# Patient Record
Sex: Male | Born: 1945 | Race: White | Hispanic: No | Marital: Married | Smoking: Never smoker
Health system: Southern US, Community
[De-identification: ages and names within clinical notes are randomized; demographics above are authoritative.]

## PROBLEM LIST (undated history)

## (undated) DIAGNOSIS — R011 Cardiac murmur, unspecified: Secondary | ICD-10-CM

## (undated) DIAGNOSIS — E1010 Type 1 diabetes mellitus with ketoacidosis without coma: Secondary | ICD-10-CM

## (undated) DIAGNOSIS — I350 Nonrheumatic aortic (valve) stenosis: Secondary | ICD-10-CM

## (undated) DIAGNOSIS — R4189 Other symptoms and signs involving cognitive functions and awareness: Secondary | ICD-10-CM

## (undated) HISTORY — DX: Cardiac murmur, unspecified: R01.1

## (undated) HISTORY — PX: SHOULDER ARTHROSCOPY WITH ROTATOR CUFF REPAIR: SHX5685

## (undated) HISTORY — DX: Other symptoms and signs involving cognitive functions and awareness: R41.89

## (undated) HISTORY — PX: APPENDECTOMY: SHX54

## (undated) HISTORY — PX: KIDNEY STONE SURGERY: SHX686

## (undated) HISTORY — DX: Type 1 diabetes mellitus with ketoacidosis without coma: E10.10

## (undated) HISTORY — DX: Nonrheumatic aortic (valve) stenosis: I35.0

---

## 2010-01-28 LAB — HM COLONOSCOPY

## 2022-02-09 ENCOUNTER — Ambulatory Visit (INDEPENDENT_AMBULATORY_CARE_PROVIDER_SITE_OTHER): Payer: Medicare Other | Admitting: Family Medicine

## 2022-02-09 ENCOUNTER — Other Ambulatory Visit: Payer: Self-pay

## 2022-02-09 ENCOUNTER — Encounter: Payer: Self-pay | Admitting: Family Medicine

## 2022-02-09 VITALS — BP 101/64 | HR 80 | Ht 64.0 in | Wt 162.0 lb

## 2022-02-09 DIAGNOSIS — E119 Type 2 diabetes mellitus without complications: Secondary | ICD-10-CM

## 2022-02-09 DIAGNOSIS — G20 Parkinson's disease: Secondary | ICD-10-CM | POA: Insufficient documentation

## 2022-02-09 DIAGNOSIS — I351 Nonrheumatic aortic (valve) insufficiency: Secondary | ICD-10-CM

## 2022-02-09 DIAGNOSIS — R2681 Unsteadiness on feet: Secondary | ICD-10-CM | POA: Diagnosis not present

## 2022-02-09 DIAGNOSIS — F418 Other specified anxiety disorders: Secondary | ICD-10-CM | POA: Insufficient documentation

## 2022-02-09 DIAGNOSIS — E7800 Pure hypercholesterolemia, unspecified: Secondary | ICD-10-CM

## 2022-02-09 DIAGNOSIS — G20C Parkinsonism, unspecified: Secondary | ICD-10-CM

## 2022-02-09 MED ORDER — ATORVASTATIN CALCIUM 10 MG PO TABS: 10.0000 mg | ORAL_TABLET | Freq: Every day | ORAL | 3 refills | Status: DC

## 2022-02-09 MED ORDER — EPINEPHRINE 0.3 MG/0.3ML IJ SOAJ: 0.3000 mg | INTRAMUSCULAR | 1 refills | Status: DC | PRN

## 2022-02-09 MED ORDER — DAPAGLIFLOZIN PROPANEDIOL 5 MG PO TABS: 5.0000 mg | ORAL_TABLET | Freq: Every day | ORAL | 3 refills | Status: DC

## 2022-02-09 MED ORDER — CARBIDOPA-LEVODOPA 25-100 MG PO TABS: 1.0000 | ORAL_TABLET | Freq: Four times a day (QID) | ORAL | 1 refills | Status: DC

## 2022-02-09 MED ORDER — BUPROPION HCL ER (XL) 300 MG PO TB24
300.0000 mg | ORAL_TABLET | Freq: Every day | ORAL | 1 refills | Status: DC
Start: 2022-02-09 — End: 2022-07-27

## 2022-02-09 MED ORDER — TAMSULOSIN HCL 0.4 MG PO CAPS: 0.4000 mg | ORAL_CAPSULE | Freq: Every day | ORAL | 3 refills | Status: DC

## 2022-02-09 MED ORDER — DONEPEZIL HCL 10 MG PO TABS: 10.0000 mg | ORAL_TABLET | Freq: Every day | ORAL | 1 refills | Status: DC

## 2022-02-09 NOTE — Patient Instructions (Signed)
Nice to meet you today! ?Change bupropion 150mg  twice per day to 300mg  once per day.  ? ?

## 2022-02-09 NOTE — Assessment & Plan Note (Signed)
Tolerating atorvastatin well.  Update lipid panel.  

## 2022-02-09 NOTE — Assessment & Plan Note (Signed)
Changing to bupropion XL and increasing to 300 mg daily. ?

## 2022-02-09 NOTE — Assessment & Plan Note (Signed)
Has parkinsonism with associated dementia.  Fairly stable with current medications.  Referral placed to neurology.  We will also place referral to physical therapy due to gait instability and falls. ?

## 2022-02-09 NOTE — Assessment & Plan Note (Signed)
Updating A1c.  He does have Visit with endocrinology. ?

## 2022-02-09 NOTE — Progress Notes (Signed)
?Ralph Jensen - 76 y.o. male MRN [id number]  Date of birth: 07-17-1946 ? ?Subjective ?Chief Complaint  ?Patient presents with  ? Establish Care  ? ? ?HPI ?Ralph Jensen is a 76 year old male here today for initial visit to establish care.  He is accompanied by his wife today.  They recently moved to the area from [place].  He has a history of parkinsonism as well as dementia.  He did have a neurologist as well as neuropsychiatric evaluation while living in [place].  Current treatment with Sinemet 4 times daily as well as Aricept 10 mg daily.  Overall he is stable with current medications.  He has had a few falls over the past couple months.  Requesting referral to physical therapy as well as to establish with neurology in the area. ? ?He has had some anxiety as well as depressive symptoms.  Current treatment with bupropion.  Tried sertraline previously but did not tolerate well.  Overall doing well bupropion but feels like dose could be adjusted. ? ?He does have history of diabetes.  He has been prescribed Farxiga and glipizide.  He was having some hypoglycemia so glipizide was held.  He has reduced his Farxiga to 5 mg.  He has already gotten an appointment with endocrinology. ? ?History of "leaky aortic valve".  Was having annual echocardiograms with cardiology and [place].  He is overdue to have this completed.  He denies any increased dyspnea or chest pain. ? ?ROS:  A comprehensive ROS was completed and negative except as noted per HPI ? ? ?Allergies  ?Allergen Reactions  ? Bee Venom   ? Fluoxetine   ? Metformin And Related   ? ? ?History reviewed. No pertinent past medical history. ? ?Past Surgical History:  ?Procedure Laterality Date  ? APPENDECTOMY    ? KIDNEY STONE SURGERY    ? SHOULDER ARTHROSCOPY WITH ROTATOR CUFF REPAIR Left   ? ? ?Social History  ? ?Socioeconomic History  ? Marital status: Married  ?  Spouse name: Not on file  ? Number of children: Not on file  ? Years of education: Not on file  ? Highest  education level: Not on file  ?Occupational History  ? Not on file  ?Tobacco Use  ? Smoking status: Never  ?  Passive exposure: Never  ? Smokeless tobacco: Never  ?Vaping Use  ? Vaping Use: Never used  ?Substance and Sexual Activity  ? Alcohol use: Never  ? Drug use: Never  ? Sexual activity: Not Currently  ?  Partners: Female  ?Other Topics Concern  ? Not on file  ?Social History Narrative  ? Not on file  ? ?Social Determinants of Health  ? ?Financial Resource Strain: Not on file  ?Food Insecurity: Not on file  ?Transportation Needs: Not on file  ?Physical Activity: Not on file  ?Stress: Not on file  ?Social Connections: Not on file  ? ? ?Family History  ?Problem Relation Age of Onset  ? Heart attack Mother   ? ? ?Health Maintenance  ?Topic Date Due  ? URINE MICROALBUMIN  Never done  ? Hepatitis C Screening  Never done  ? COVID-19 Vaccine (5 - Booster for Moderna series) 02/25/2022 (Originally 05/10/2021)  ? Pneumonia Vaccine 79+ Years old (1 - PCV) 02/10/2023 (Originally 07/02/2010)  ? TETANUS/TDAP  02/10/2023 (Originally 07/02/1964)  ? INFLUENZA VACCINE  Completed  ? Zoster Vaccines- Shingrix  Completed  ? HPV VACCINES  Aged Out  ? ? ? ?----------------------------------------------------------------------------------------------------------------------------------------------------------------------------------------------------------------- ?Physical Exam ?BP 101/64 (BP Location: Left  Arm, Patient Position: Sitting, Cuff Size: Normal)   Pulse 80   Ht 5\' 4"  (1.626 m)   Wt 162 lb (73.5 kg)   SpO2 99%   BMI 27.81 kg/m?  ? ?Physical Exam ?Constitutional:   ?   Appearance: Normal appearance.  ?HENT:  ?   Head: Normocephalic and atraumatic.  ?Eyes:  ?   General: No scleral icterus. ?Cardiovascular:  ?   Rate and Rhythm: Normal rate and regular rhythm.  ?   Heart sounds: Murmur (Systolic ejection murmur 2 out of 6) heard.  ?Musculoskeletal:  ?   Cervical back: Neck supple.  ?Neurological:  ?   General: No focal  deficit present.  ?   Mental Status: He is alert.  ?Psychiatric:     ?   Mood and Affect: Mood normal.     ?   Behavior: Behavior normal.  ? ? ?------------------------------------------------------------------------------------------------------------------------------------------------------------------------------------------------------------------- ?Assessment and Plan ? ?Parkinsonism (HCC) ?Has parkinsonism with associated dementia.  Fairly stable with current medications.  Referral placed to neurology.  We will also place referral to physical therapy due to gait instability and falls. ? ?Depression with anxiety ?Changing to bupropion XL and increasing to 300 mg daily. ? ?Pure hypercholesterolemia ?Tolerating atorvastatin well.  Update lipid panel. ? ?Type 2 diabetes mellitus without complication, without long-term current use of insulin (HCC) ?Updating A1c.  He does have Visit with endocrinology. ? ? ?Meds ordered this encounter  ?Medications  ? atorvastatin (LIPITOR) 10 MG tablet  ?  Sig: Take 1 tablet (10 mg total) by mouth daily.  ?  Dispense:  90 tablet  ?  Refill:  3  ? carbidopa-levodopa (SINEMET IR) 25-100 MG tablet  ?  Sig: Take 1 tablet by mouth 4 (four) times daily.  ?  Dispense:  360 tablet  ?  Refill:  1  ? donepezil (ARICEPT) 10 MG tablet  ?  Sig: Take 1 tablet (10 mg total) by mouth daily.  ?  Dispense:  90 tablet  ?  Refill:  1  ? EPINEPHrine 0.3 mg/0.3 mL IJ SOAJ injection  ?  Sig: Inject 0.3 mg into the muscle as needed for anaphylaxis.  ?  Dispense:  1 each  ?  Refill:  1  ? tamsulosin (FLOMAX) 0.4 MG CAPS capsule  ?  Sig: Take 1 capsule (0.4 mg total) by mouth daily.  ?  Dispense:  90 capsule  ?  Refill:  3  ? dapagliflozin propanediol (FARXIGA) 5 MG TABS tablet  ?  Sig: Take 1 tablet (5 mg total) by mouth daily before breakfast.  ?  Dispense:  90 tablet  ?  Refill:  3  ? buPROPion (WELLBUTRIN XL) 300 MG 24 hr tablet  ?  Sig: Take 1 tablet (300 mg total) by mouth daily.  ?  Dispense:  90  tablet  ?  Refill:  1  ? ? ?Return in about 6 months (around 08/12/2022) for Depression/anxiety. ? ? ? ?This visit occurred during the SARS-CoV-2 public health emergency.  Safety protocols were in place, including screening questions prior to the visit, additional usage of staff PPE, and extensive cleaning of exam room while observing appropriate contact time as indicated for disinfecting solutions.  ? ?

## 2022-02-09 NOTE — Assessment & Plan Note (Signed)
Asymptomatic at this time.  Updated echocardiogram ordered. ?

## 2022-02-10 LAB — CBC WITH DIFFERENTIAL/PLATELET
Absolute Monocytes: 936 cells/uL (ref 200–950)
Basophils Absolute: 58 cells/uL (ref 0–200)
Basophils Relative: 0.8 %
Eosinophils Absolute: 151 cells/uL (ref 15–500)
Eosinophils Relative: 2.1 %
HCT: 50.9 % — ABNORMAL HIGH (ref 38.5–50.0)
Hemoglobin: 17 g/dL (ref 13.2–17.1)
Lymphs Abs: 1627 cells/uL (ref 850–3900)
MCH: 31.1 pg (ref 27.0–33.0)
MCHC: 33.4 g/dL (ref 32.0–36.0)
MCV: 93.1 fL (ref 80.0–100.0)
MPV: 9.8 fL (ref 7.5–12.5)
Monocytes Relative: 13 %
Neutro Abs: 4428 cells/uL (ref 1500–7800)
Neutrophils Relative %: 61.5 %
Platelets: 254 10*3/uL (ref 140–400)
RBC: 5.47 10*6/uL (ref 4.20–5.80)
RDW: 12.7 % (ref 11.0–15.0)
Total Lymphocyte: 22.6 %
WBC: 7.2 10*3/uL (ref 3.8–10.8)

## 2022-02-10 LAB — COMPLETE METABOLIC PANEL WITH GFR
AG Ratio: 1.6 (calc) (ref 1.0–2.5)
ALT: 9 U/L (ref 9–46)
AST: 13 U/L (ref 10–35)
Albumin: 4.5 g/dL (ref 3.6–5.1)
Alkaline phosphatase (APISO): 86 U/L (ref 35–144)
BUN/Creatinine Ratio: 25 (calc) — ABNORMAL HIGH (ref 6–22)
BUN: 27 mg/dL — ABNORMAL HIGH (ref 7–25)
CO2: 24 mmol/L (ref 20–32)
Calcium: 10 mg/dL (ref 8.6–10.3)
Chloride: 103 mmol/L (ref 98–110)
Creat: 1.1 mg/dL (ref 0.70–1.28)
Globulin: 2.9 g/dL (calc) (ref 1.9–3.7)
Glucose, Bld: 124 mg/dL (ref 65–139)
Potassium: 4.2 mmol/L (ref 3.5–5.3)
Sodium: 140 mmol/L (ref 135–146)
Total Bilirubin: 1.4 mg/dL — ABNORMAL HIGH (ref 0.2–1.2)
Total Protein: 7.4 g/dL (ref 6.1–8.1)
eGFR: 70 mL/min/{1.73_m2} (ref >=60)

## 2022-02-10 LAB — URINALYSIS, ROUTINE W REFLEX MICROSCOPIC
Bilirubin Urine: NEGATIVE
Hgb urine dipstick: NEGATIVE
Ketones, ur: NEGATIVE
Leukocytes,Ua: NEGATIVE
Nitrite: NEGATIVE
Protein, ur: NEGATIVE
Specific Gravity, Urine: 1.034 (ref 1.001–1.035)
pH: <=5 (ref 5.0–8.0)

## 2022-02-10 LAB — LIPID PANEL W/REFLEX DIRECT LDL
Cholesterol: 171 mg/dL (ref <200)
HDL: 44 mg/dL (ref >=40)
LDL Cholesterol (Calc): 92 mg/dL (calc)
Non-HDL Cholesterol (Calc): 127 mg/dL (calc) (ref <130)
Total CHOL/HDL Ratio: 3.9 (calc) (ref <5.0)
Triglycerides: 267 mg/dL — ABNORMAL HIGH (ref <150)

## 2022-02-10 LAB — HEMOGLOBIN A1C
Hgb A1c MFr Bld: 6.5 % of total Hgb — ABNORMAL HIGH (ref <5.7)
Mean Plasma Glucose: 140 mg/dL
eAG (mmol/L): 7.7 mmol/L

## 2022-02-14 ENCOUNTER — Other Ambulatory Visit: Payer: Self-pay

## 2022-02-14 ENCOUNTER — Ambulatory Visit (INDEPENDENT_AMBULATORY_CARE_PROVIDER_SITE_OTHER): Payer: Medicare Other

## 2022-02-14 ENCOUNTER — Ambulatory Visit (INDEPENDENT_AMBULATORY_CARE_PROVIDER_SITE_OTHER): Payer: Medicare Other | Admitting: Sports Medicine

## 2022-02-14 DIAGNOSIS — G8929 Other chronic pain: Secondary | ICD-10-CM

## 2022-02-14 DIAGNOSIS — M25512 Pain in left shoulder: Secondary | ICD-10-CM | POA: Diagnosis not present

## 2022-02-14 IMAGING — DX DG SHOULDER 2+V*L*
3 series · 3 of 3 positions shown · non-contrast
Comparison: None.

CLINICAL DATA: Chronic left shoulder pain. Localized over the
deltoid. History of arthroscopic debridement back in [REDACTED].

EXAM:
LEFT SHOULDER - 2+ VIEW

[shoulder grashey]
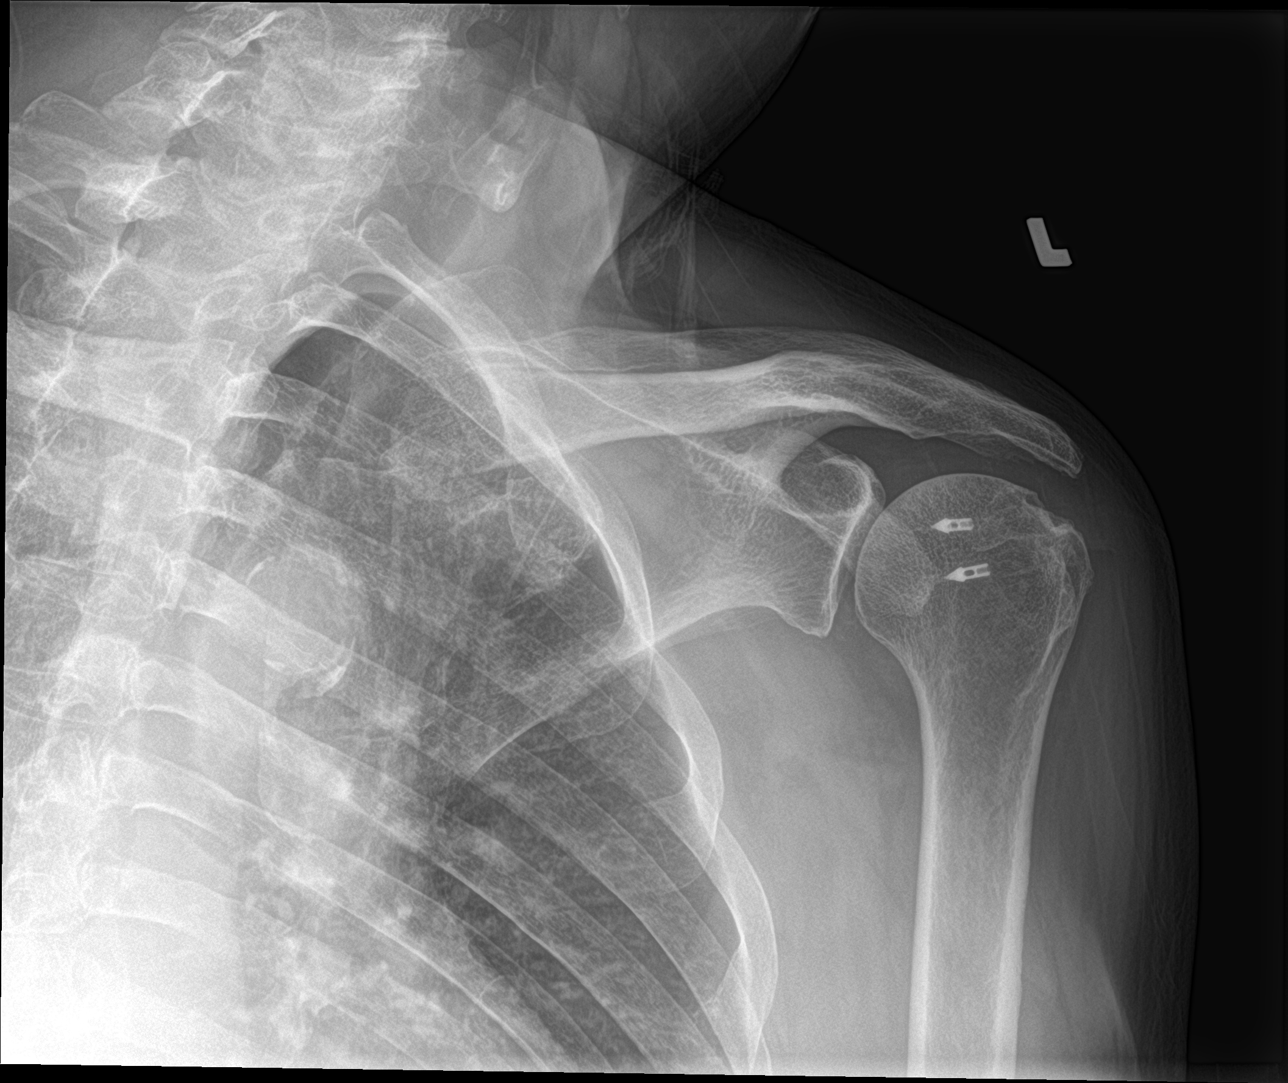

[shoulder y view]
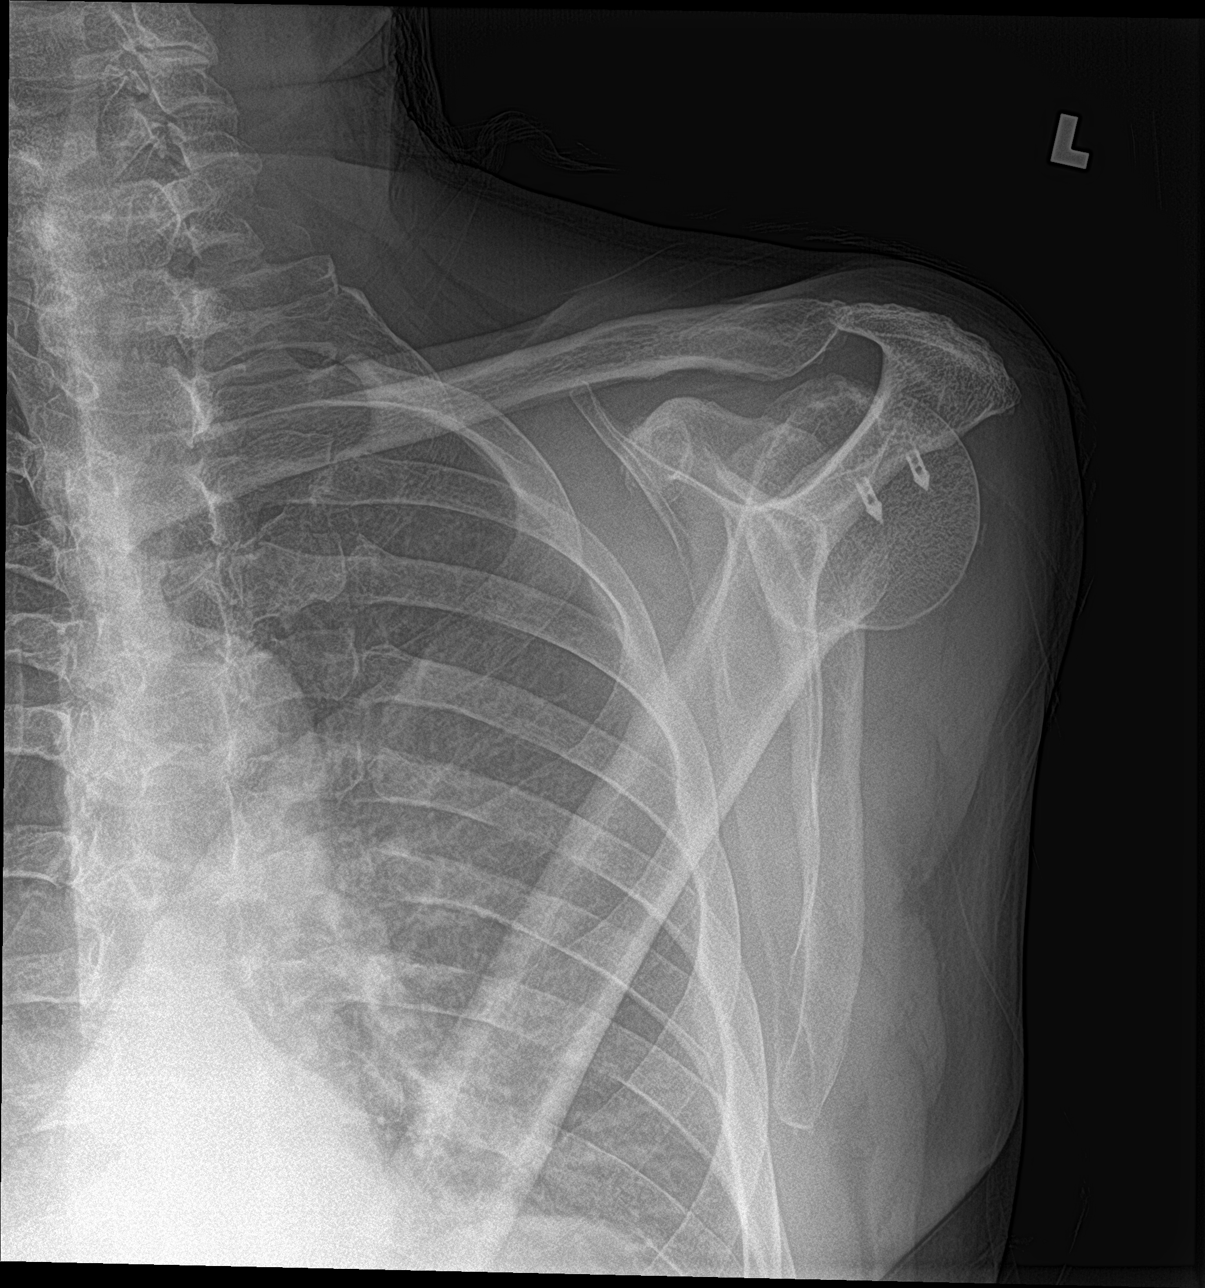

[shoulder axillary]
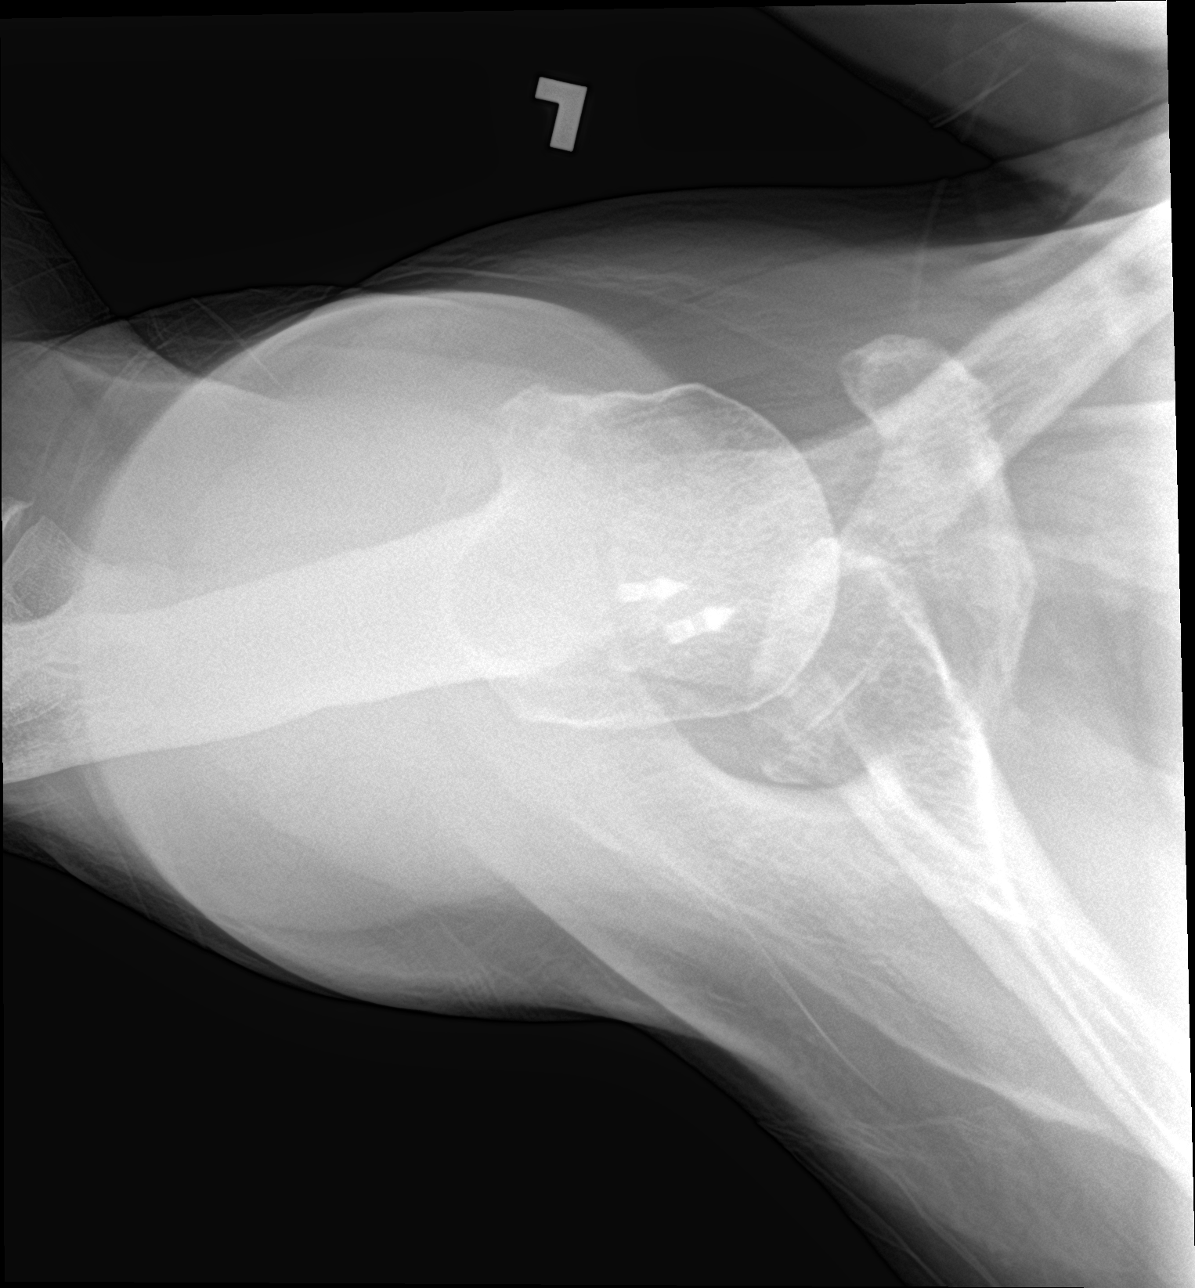

[3 of 3 positions shown; findings below may reference images not displayed]

FINDINGS: Glenohumeral joint space is maintained. Two screw anchors overlie
the slightly posterior aspect of the humeral head. Mild distal
lateral subacromial spurring. Mild concavity within the adjacent
superolateral aspect of the humeral head, possibly from chronic
impingement. Possible prior postsurgical changes of acromioplasty.
No acute fracture or dislocation.

Mild-to-moderate atherosclerotic calcifications within the thoracic
aorta.
IMPRESSION: Postsurgical changes including 2 screw anchors within the humeral
head and likely prior acromioplasty.

a mild distal lateral subacromial spurring. Mild concavity within
the adjacent superolateral aspect of the humeral head, possibly from
chronic impingement.

## 2022-02-14 MED ORDER — NAPROXEN 500 MG PO TABS: 500.0000 mg | ORAL_TABLET | Freq: Two times a day (BID) | ORAL | 3 refills | Status: DC

## 2022-02-14 NOTE — Assessment & Plan Note (Addendum)
This is a pleasant 76 year old male, history of Parkinson's disease, recently moved from [place], chronic left shoulder pain, localized over the deltoid, history of arthroscopic debridement back in the 80s, he has had an injection, unsure as to what site back in December, x-rays back in December. ?We will be getting records from his prior orthopedic surgeon, Dr. Ricarda Frame at the [facility] in [place]. ?Persistent pain, he tells me his orthopedic surgeon told him he would need a shoulder arthroplasty. ?On exam he has loss of external rotation, as well as positive impingement signs. ?We will get updated x-rays, he is only doing over-the-counter naproxen, switching to prescription strength 500 twice a day. ?Adding formal physical therapies. ?Return to see me in 6 weeks, we will likely do a glenohumeral injection if not better. ?

## 2022-02-14 NOTE — Progress Notes (Signed)
? ? ?  Procedures performed today:   ? ?None. ? ?Independent interpretation of notes and tests performed by another provider:  ? ?None. ? ?Brief History, Exam, Impression, and Recommendations:   ? ?Chronic left shoulder pain ?This is a pleasant 76 year old male, history of Parkinson's disease, recently moved from [place], chronic left shoulder pain, localized over the deltoid, history of arthroscopic debridement back in the 80s, he has had an injection, unsure as to what site back in December, x-rays back in December. ?We will be getting records from his prior orthopedic surgeon, Dr. Ricarda Frame at the [facility] in [place]. ?Persistent pain, he tells me his orthopedic surgeon told him he would need a shoulder arthroplasty. ?On exam he has loss of external rotation, as well as positive impingement signs. ?We will get updated x-rays, he is only doing over-the-counter naproxen, switching to prescription strength 500 twice a day. ?Adding formal physical therapies. ?Return to see me in 6 weeks, we will likely do a glenohumeral injection if not better. ? ?Chronic process with exacerbation and pharmacologic intervention ? ?___________________________________________ ?Gwen Her. Dianah Field, M.D., ABFM., CAQSM. ?Primary Care and Sports Medicine ?[place] ? ?Adjunct Instructor of Family Medicine  ?[employer] ?

## 2022-02-15 ENCOUNTER — Ambulatory Visit: Payer: Medicare Other | Attending: Family Medicine | Admitting: Rehabilitative and Restorative Service Providers"

## 2022-02-15 DIAGNOSIS — R2681 Unsteadiness on feet: Secondary | ICD-10-CM | POA: Diagnosis present

## 2022-02-15 DIAGNOSIS — M6281 Muscle weakness (generalized): Secondary | ICD-10-CM | POA: Insufficient documentation

## 2022-02-15 DIAGNOSIS — R2689 Other abnormalities of gait and mobility: Secondary | ICD-10-CM | POA: Diagnosis present

## 2022-02-15 DIAGNOSIS — M25512 Pain in left shoulder: Secondary | ICD-10-CM | POA: Insufficient documentation

## 2022-02-15 DIAGNOSIS — G8929 Other chronic pain: Secondary | ICD-10-CM | POA: Insufficient documentation

## 2022-02-15 DIAGNOSIS — R42 Dizziness and giddiness: Secondary | ICD-10-CM | POA: Diagnosis present

## 2022-02-15 NOTE — Patient Instructions (Signed)
Access Code: [email or web address] ?URL: [email or web address] ?Date: 02/15/2022 ?Prepared by: Margretta Ditty ? ?Exercises ?Supine Shoulder Flexion AAROM with Dowel - 2 x daily - 7 x weekly - 1 sets - 10 reps ?Supine Shoulder External Rotation with Dowel at 20 Degrees of Abduction - 2 x daily - 7 x weekly - 1 sets - 10 reps ?Sit to Stand with Arm Swing - 2 x daily - 7 x weekly - 1 sets - 5-10 reps ? ?

## 2022-02-15 NOTE — Therapy (Signed)
[facility] ?[address] [place] [address] ?[place], [place], [address] ?Phone: [phone]   Fax:  [phone] ? ?Physical Therapy Evaluation ? ?Patient Details  ?Name: Ralph Jensen ?MRN: [id number] ?Date of Birth: [birth year] ?Referring Provider (PT): Dr. Benjamin Stain (shoulder), Dr. Ashley Royalty (gait) ? ? ?Encounter Date: 02/15/2022 ? ? PT End of Session - 02/15/22 1434   ? ? Visit Number 1   ? Number of Visits 16   ? Date for PT Re-Evaluation 04/16/22   ? Authorization Type medicare   ? Authorization - Visit Number 1   ? Progress Note Due on Visit 10   ? PT Start Time 1104   ? PT Stop Time 1155   ? PT Time Calculation (min) 51 min   ? Activity Tolerance Patient tolerated treatment well   ? Behavior During Therapy [facility] for tasks assessed/performed   ? ?  ?  ? ?  ? ? ?No past medical history on file. ? ?Past Surgical History:  ?Procedure Laterality Date  ? APPENDECTOMY    ? KIDNEY STONE SURGERY    ? SHOULDER ARTHROSCOPY WITH ROTATOR CUFF REPAIR Left   ? ? ?There were no vitals filed for this visit. ? ? ? Subjective Assessment - 02/15/22 1107   ? ? Subjective The patient is referred for gait instability and L shoulder pain.  He reports "I'm careful about falling." He avoids walking outdoors at night.  He had a fall last week and reports 2 falls in the past 6 months (1 indoors and 1 outside). His L shoulder began hurting in the fall of 2022. He wants to work on strengthening the shoulder to avoid a shoulder replacement. He moved from [place] to [place] in 2023.   ? Pertinent History Parkinson's Disease, diabetes.   ? Diagnostic tests Xray ordered yesterday   ? Patient Stated Goals strengthen L shoulder, improve balance   ? Currently in Pain? Yes   ? Pain Score 8    ? Pain Location Shoulder   ? Pain Orientation Left   ? Pain Descriptors / Indicators Aching;Discomfort;Sore   ? Pain Onset More than a month ago   ? Pain Frequency Intermittent   ? Aggravating Factors  abduction hurts  (he demonstrates movement)   ? Pain Relieving Factors rest   ? ?  ?  ? ?  ? ? ? ? ? [facility] PT Assessment - 02/15/22 1114   ? ?  ? Assessment  ? Medical Diagnosis gait instability and chronic L shoulder pain   ? Referring Provider (PT) Dr. Benjamin Stain (shoulder), Dr. Ashley Royalty (gait)   ? Onset Date/Surgical Date 02/09/22   ? Hand Dominance Right   ? Prior Therapy h/o PT for balance, vestibular rehab in 2021 and 2022 (he reports "every 12-18 months I need to go through therapy")   ?  ? Precautions  ? Precautions Fall   ?  ? Restrictions  ? Weight Bearing Restrictions No   ?  ? Balance Screen  ? Has the patient fallen in the past 6 months Yes   ? How many times? 2   ? Has the patient had a decrease in activity level because of a fear of falling?  Yes   ? Is the patient reluctant to leave their home because of a fear of falling?  No   ?  ? Home Environment  ? Living Environment Private residence   ? Living Arrangements Spouse/significant other   ? Type of Home House   ? Home  Layout Multi-level   ? Alternate Level Stairs-Number of Steps 12   ? Alternate Level Stairs-Rails Right   ? Home Equipment Walker - 4 wheels;Cane - single point   ? Additional Comments does steps 2 times/day   ?  ? Prior Function  ? Level of Independence Independent with community mobility with device;Independent with basic ADLs   ? Vocation Retired   ?  ? Cognition  ? Overall Cognitive Status --   notes decline in memory with PD  ?  ? Observation/Other Assessments  ? Focus on Therapeutic Outcomes (FOTO)  n/a- due to 2 diagnoses   ?  ? Sensation  ? Light Touch Appears Intact   ?  ? Posture/Postural Control  ? Posture/Postural Control Postural limitations   ? Posture Comments mild shoulder rounding and forward head   ?  ? ROM / Strength  ? AROM / PROM / Strength AROM;Strength   ?  ? AROM  ? Overall AROM  Deficits   ? AROM Assessment Site Shoulder   ? Right/Left Shoulder Right;Left   ? Right Shoulder Flexion 145 Degrees   ? Right Shoulder ABduction 150  Degrees   ? Right Shoulder External Rotation 75 Degrees   ? Left Shoulder Flexion 118 Degrees   ? Left Shoulder ABduction 130 Degrees   ? Left Shoulder External Rotation 60 Degrees   ?  ? Strength  ? Overall Strength Deficits   ? Strength Assessment Site Shoulder   ? Right/Left Shoulder Right;Left   ? Right Shoulder Flexion 4+/5   ? Right Shoulder ABduction 4+/5   ? Left Shoulder Flexion 4-/5   ? Left Shoulder ABduction 4-/5   ?  ? Special Tests  ?  Special Tests Rotator Cuff Impingement   ? Rotator Cuff Impingment tests Neer impingement test;Hawkins- Kyung Rudd test;Empty Can test   ?  ? Neer Impingement test   ? Findings Positive   ? Side Left   ? Comments anterior shoulder pain   ?  ? Hawkins-Kennedy test  ? Findings Negative   ? Side Left   ?  ? Empty Can test  ? Findings Positive   ? Side Left   ? Comment weakness, no pain   ?  ? Transfers  ? Five time sit to stand comments  14.20 seconds with uncontrolled descent   ?  ? Ambulation/Gait  ? Ambulation/Gait Yes   ? Ambulation/Gait Assistance 6: Modified independent (Device/Increase time)   with cane  ? Ambulation Distance (Feet) 100 Feet   ? Assistive device Straight cane   ? Gait Pattern Step-through pattern;Decreased stride length   occasional dec'd heel strike L foot  ? Ambulation Surface Level;Indoor   ?  ? Standardized Balance Assessment  ? Standardized Balance Assessment Mini-BESTest;Timed Up and Go Test   ?  ? Mini-BESTest  ? Sit To Stand Moderate: Comes to stand WITH use of hands on first attempt.   ? Rise to Toes Moderate: Heels up, but not full range (smaller than when holding hands), OR noticeable instability for 3 s.   ? Stand on one leg (left) Severe: Unable   ? Stand on one leg (right) Severe: Unable   ? Stand on one leg - lowest score 0   ? Compensatory Stepping Correction - Forward Normal: Recovers independently with a single, large step (second realignement is allowed).   ? Compensatory Stepping Correction - Backward Normal: Recovers independently  with a single, large step   ? Compensatory Stepping Correction - Left Lateral Severe:  Falls, or cannot step   ? Compensatory Stepping Correction - Right Lateral Severe:  Falls, or cannot step   ? Stepping Corredtion Lateral - lowest score 0   ? Stance - Feet together, eyes open, firm surface  Normal: 30s   ? Stance - Feet together, eyes closed, foam surface  Moderate: < 30s   ? Incline - Eyes Closed Moderate: Stands independently < 30s OR aligns with surface   ? Change in Gait Speed Moderate: Unable to change walking speed or signs of imbalance   ? Walk with head turns - Horizontal Severe: performs head turns with imbalance   ? Walk with pivot turns Moderate:Turns with feet close SLOW (>4 steps) with good balance.   ? Step over obstacles Moderate: Steps over box but touches box OR displays cautious behavior by slowing gait.   ? Timed UP & GO with Dual Task Moderate: Dual Task affects either counting OR walking (>10%) when compared to the TUG without Dual Task.   ? Mini-BEST total score 14   ?  ? Timed Up and Go Test  ? Normal TUG (seconds) 11.5   ? Cognitive TUG (seconds) 15   ? TUG Comments >10% increase with dual tasking indicates fall risk   ? ?  ?  ? ?  ? ? ? ? ? ? ? ? ? Vestibular Assessment - 02/15/22 1149   ? ?  ? Vestibular Assessment  ? General Observation *Anticipate vestibular issues-- patient cannot turn to the R while sleeping (sleeps on the left side due to vertigo).  He also has to wait upon rising from supine to allow dizziness to settle.   ? ?  ?  ? ?  ? ? ? ? ? ?Objective measurements completed on examination: See above findings.  ? ? ? ? ? [facility] Adult PT Treatment/Exercise - 02/15/22 1114   ? ?  ? Exercises  ? Exercises Shoulder   ?  ? Shoulder Exercises: Supine  ? External Rotation AAROM;Left;10 reps   ? Flexion AAROM;Left;10 reps   ? ?  ?  ? ?  ? ? ? ? ? ? ? ? ? ? PT Education - 02/15/22 1155   ? ? Education Details HEP   ? Person(s) Educated Patient   ? Methods Explanation;Demonstration;Handout    ? Comprehension Verbalized understanding;Returned demonstration   ? ?  ?  ? ?  ? ? ? PT Short Term Goals - 02/15/22 1442   ? ?  ? PT SHORT TERM GOAL #1  ? Title The patient will be indep with HEP for L

## 2022-02-17 ENCOUNTER — Ambulatory Visit: Payer: Medicare Other | Admitting: Physical Therapy

## 2022-02-17 ENCOUNTER — Encounter: Payer: Self-pay | Admitting: Sports Medicine

## 2022-02-17 ENCOUNTER — Other Ambulatory Visit: Payer: Self-pay

## 2022-02-17 DIAGNOSIS — M25512 Pain in left shoulder: Secondary | ICD-10-CM | POA: Diagnosis not present

## 2022-02-17 DIAGNOSIS — M6281 Muscle weakness (generalized): Secondary | ICD-10-CM

## 2022-02-17 DIAGNOSIS — R2689 Other abnormalities of gait and mobility: Secondary | ICD-10-CM

## 2022-02-17 NOTE — Therapy (Signed)
[place] ?[facility] ?[address] ?[place], [place], [address] ?Phone: [phone]   Fax:  [phone] ? ?Physical Therapy Treatment ? ?Patient Details  ?Name: Ralph SchwalbeMichael Jensen ?MRN: [id number] ?Date of Birth: 09/18/1946 ?Referring Provider (PT): Dr. Benjamin Stainhekkekandam (shoulder), Dr. Ashley RoyaltyMatthews (gait) ? ? ?Encounter Date: 02/17/2022 ? ? PT End of Session - 02/17/22 1058   ? ? Visit Number 2   ? Number of Visits 16   ? Date for PT Re-Evaluation 04/16/22   ? Authorization Type medicare   ? Authorization - Visit Number 2   ? Progress Note Due on Visit 10   ? PT Start Time 1100   ? PT Stop Time 1145   ? PT Time Calculation (min) 45 min   ? Activity Tolerance Patient tolerated treatment well   ? Behavior [facility] for tasks assessed/performed   ? ?  ?  ? ?  ? ? ?No past medical history on file. ? ?Past Surgical History:  ?Procedure Laterality Date  ? APPENDECTOMY    ? KIDNEY STONE SURGERY    ? SHOULDER ARTHROSCOPY WITH ROTATOR CUFF REPAIR Left   ? ? ?There were no vitals filed for this visit. ? ? Subjective Assessment - 02/17/22 1102   ? ? Subjective Pt reports he's to get the results of his shoulder x-ray today. Declines doing any treatments for BPPV. Pt reports trying the exercises with the cane.   ? Pertinent History Parkinson's Disease, diabetes.   ? Diagnostic tests Xray ordered yesterday   ? Patient Stated Goals strengthen L shoulder, improve balance   ? Currently in Pain? Yes   ? Pain Score 6    ? Pain Location Shoulder   ? Pain Onset More than a month ago   ? ?  ?  ? ?  ? ? ? ? ? [facility] PT Assessment - 02/17/22 0001   ? ?  ? Assessment  ? Medical Diagnosis gait instability and chronic L shoulder pain   ? Referring Provider (PT) Dr. Benjamin Stainhekkekandam (shoulder), Dr. Ashley RoyaltyMatthews (gait)   ? Onset Date/Surgical Date 02/09/22   ? Hand Dominance Right   ? Prior Therapy h/o PT for balance, vestibular rehab in 2021 and 2022 (he reports "every 12-18 months I need to go through  therapy")   ? ?  ?  ? ?  ? ? ? ? ? ? ? ? ? ? ? ? ? ? ? ? [facility] Adult PT Treatment/Exercise - 02/17/22 0001   ? ?  ? Lumbar Exercises: Aerobic  ? Nustep L4 x 5 min, UEs/LEs   ?  ? Lumbar Exercises: Standing  ? Other Standing Lumbar Exercises Standing weight shift with arms flexed L<>R x 5;   ? Other Standing Lumbar Exercises Trialed standing trunk rotation; however, pt became too dizzy   ?  ? Lumbar Exercises: Seated  ? Sit to Stand 5 reps   ? Sit to Stand Limitations big sit to stands with arms outstretched   ?  ? Shoulder Exercises: Supine  ? External Rotation AAROM;Left;10 reps;AROM   ? Flexion AAROM;Left;10 reps;AROM   ? ABduction AAROM;Left;10 reps;AROM   ? Other Supine Exercises "W" x10   ?  ? Shoulder Exercises: Isometric Strengthening  ? Flexion 5X5"   ? External Rotation 5X5"   ? Internal Rotation 5X5"   ? ABduction 5X5"   ? ?  ?  ? ?  ? ? ? ? ? ? ? ? ? ? ? ? PT Short Term  Goals - 02/15/22 1442   ? ?  ? PT SHORT TERM GOAL #1  ? Title The patient will be indep with HEP for L shoulder ROM, strength, as well as large amplitude movements for balance/ parkinson's.   ? Time 4   ? Period Weeks   ? Target Date 03/17/22   ?  ? PT SHORT TERM GOAL #2  ? Title The patient will be further assessed for vertigo as indicated and LTG to follow, if needed.   ? Time 4   ? Period Weeks   ? Target Date 03/17/22   ?  ? PT SHORT TERM GOAL #3  ? Title The patient will reduce L shoulder pain to < or equal to 3/10.   ? Baseline 8/10   ? Time 4   ? Period Weeks   ? Target Date 03/17/22   ?  ? PT SHORT TERM GOAL #4  ? Title The patient will improve L shoulder AROM flexion to 140 degrees with pain < or equal to 2/10.   ? Baseline 118 degrees.   ? Time 4   ? Period Weeks   ? Target Date 03/17/22   ? ?  ?  ? ?  ? ? ? ? PT Long Term Goals - 02/15/22 1444   ? ?  ? PT LONG TERM GOAL #1  ? Title The patient will be indep with HEP progression.   ? Time 8   ? Period Weeks   ? Target Date 04/16/22   ?  ? PT LONG TERM GOAL #2  ? Title The patient  will improve mini BEST test from 14/28 up to 18/28 to demo improving balance.   ? Time 8   ? Period Weeks   ? Target Date 04/16/22   ?  ? PT LONG TERM GOAL #3  ? Title The patient will be able to lift 3 lb object to overhead shelf with L UE to demo improved L shoulder strength.   ? Time 8   ? Period Weeks   ? Target Date 04/16/22   ?  ? PT LONG TERM GOAL #4  ? Title The patient will verbalize understanding of post d/c PWR! classes and POP support group for ongoing mgmt of PD.   ? Time 8   ? Period Weeks   ? Target Date 04/16/22   ? ?  ?  ? ?  ? ? ? ? ? ? ? ? Plan - 02/17/22 1250   ? ? Clinical Impression Statement Reviewed pt's HEP and added abduction AAROM as well as shoulder isometrics. Pt had deferred addressing his vertigo this session. Request to address it on his next visit on Monday. Initiated PWR! exercises. Worked on sit<>stand and standing weight shift. While attempting large amplitude standing twist, pt became dizzy due to his likely BPPV. Unable to complete more exercises after this.   ? Personal Factors and Comorbidities Comorbidity 3+   ? Comorbidities h/o arthroscopic shoulder procedures, h/o Parkinson's, diabetes   ? Examination-Activity Limitations Lift;Reach Overhead;Locomotion Level;Squat;Stairs;Stand   ? Examination-Participation Restrictions Community Activity   ? Stability/Clinical Decision Making Stable/Uncomplicated   ? Rehab Potential Good   ? PT Frequency 2x / week   ? PT Duration 8 weeks   ? PT Treatment/Interventions ADLs/Self Care Home Management;Canalith Repostioning;Vestibular;Patient/family education;Gait training;Stair training;Functional mobility training;Therapeutic activities;Therapeutic exercise;Balance training;Neuromuscular re-education;Manual techniques;Dry needling;Taping;Moist Heat;Iontophoresis 4mg /ml Dexamethasone;Electrical Stimulation   ? PT Next Visit Plan Add PWR! large amplitude standing twist, standing weight shift, and step.  AAROM L shoulder, L shoulder  strengthening (isometrics to begin and progress), add further L shoulder HEP.   ? Consulted and Agree with Plan of Care Patient   ? ?  ?  ? ?  ? ? ?Patient will benefit from skilled therapeutic intervention in order to improve the following deficits and impairments:  Abnormal gait, Decreased activity tolerance, Decreased balance, Decreased mobility, Decreased strength, Postural dysfunction, Decreased range of motion, Increased fascial restricitons, Dizziness ? ?Visit Diagnosis: ?Acute pain of left shoulder ? ?Muscle weakness (generalized) ? ?Other abnormalities of gait and mobility ? ? ? ? ?Problem List ?Patient Active Problem List  ? Diagnosis Date Noted  ? Chronic left shoulder pain 02/14/2022  ? Gait instability 02/09/2022  ? Parkinsonism (HCC) 02/09/2022  ? Nonrheumatic aortic valve insufficiency 02/09/2022  ? Type 2 diabetes mellitus without complication, without long-term current use of insulin (HCC) 02/09/2022  ? Pure hypercholesterolemia 02/09/2022  ? Depression with anxiety 02/09/2022  ? ? ?Ricco Dershem April Dell Ponto, PT, DPT ?02/17/2022, 12:54 PM ? ?[place] ?[facility] ?[address] ?[place], [place], [address] ?Phone: [phone]   Fax:  [phone] ? ?Name: Xayvier Vallez ?MRN: [id number] ?Date of Birth: 18-Nov-1946 ? ? ? ?

## 2022-02-23 ENCOUNTER — Other Ambulatory Visit: Payer: Self-pay

## 2022-02-23 ENCOUNTER — Ambulatory Visit: Payer: Medicare Other | Admitting: Physical Therapy

## 2022-02-23 DIAGNOSIS — M25512 Pain in left shoulder: Secondary | ICD-10-CM

## 2022-02-23 DIAGNOSIS — M6281 Muscle weakness (generalized): Secondary | ICD-10-CM

## 2022-02-23 DIAGNOSIS — R2681 Unsteadiness on feet: Secondary | ICD-10-CM

## 2022-02-23 DIAGNOSIS — R42 Dizziness and giddiness: Secondary | ICD-10-CM

## 2022-02-23 DIAGNOSIS — R2689 Other abnormalities of gait and mobility: Secondary | ICD-10-CM

## 2022-02-23 NOTE — Therapy (Signed)
[place] ?[facility] ?[address] ?[place], [place], [address] ?Phone: [phone]   Fax:  [phone] ? ?Physical Therapy Treatment ? ?Patient Details  ?Name: Ralph SchwalbeMichael Jensen ?MRN: [id number] ?Date of Birth: 08/03/1946 ?Referring Provider (PT): Dr. Benjamin Stainhekkekandam (shoulder), Dr. Ashley RoyaltyMatthews (gait) ? ? ?Encounter Date: 02/23/2022 ? ? PT End of Session - 02/23/22 1231   ? ? Visit Number 3   ? Number of Visits 16   ? Date for PT Re-Evaluation 04/16/22   ? Authorization Type medicare   ? Authorization - Visit Number 3   ? Progress Note Due on Visit 10   ? PT Start Time 1145   ? PT Stop Time 1215   ? PT Time Calculation (min) 30 min   ? Activity Tolerance Patient tolerated treatment well   ? Behavior During Therapy [facility] for tasks assessed/performed   ? ?  ?  ? ?  ? ? ?No past medical history on file. ? ?Past Surgical History:  ?Procedure Laterality Date  ? APPENDECTOMY    ? KIDNEY STONE SURGERY    ? SHOULDER ARTHROSCOPY WITH ROTATOR CUFF REPAIR Left   ? ? ?There were no vitals filed for this visit. ? ? Subjective Assessment - 02/23/22 1221   ? ? Subjective Pt states he had a hard time driving home after last session. He states he read up on BPPV at home and feels ready for canalith repositioning today. Pt's wife drove him and will drive him back.   ? Pertinent History Parkinson's Disease, diabetes.   ? Diagnostic tests Xray ordered yesterday   ? Patient Stated Goals strengthen L shoulder, improve balance   ? Currently in Pain? Yes   ? Pain Score 6    ? Pain Location Shoulder   ? Pain Onset More than a month ago   ? ?  ?  ? ?  ? ? ? ? ? [facility] PT Assessment - 02/23/22 0001   ? ?  ? Assessment  ? Medical Diagnosis gait instability and chronic L shoulder pain   ? Referring Provider (PT) Dr. Benjamin Stainhekkekandam (shoulder), Dr. Ashley RoyaltyMatthews (gait)   ? Onset Date/Surgical Date 02/09/22   ? Hand Dominance Right   ? Prior Therapy h/o PT for balance, vestibular rehab in 2021 and 2022 (he reports  "every 12-18 months I need to go through therapy")   ? ?  ?  ? ?  ? ? ? ? ? ? Vestibular Assessment - 02/23/22 0001   ? ?  ? Symptom Behavior  ? Subjective history of current problem Reports dizziness with right sidelying. He also notes feeling it when laying down to perform his shoulder exercises.   ?  ? Positional Testing  ? Dix-Hallpike Dix-Hallpike Right;Dix-Hallpike Left   ?  ? Dix-Hallpike Right  ? Dix-Hallpike Right Duration ~6 sec   ? Dix-Hallpike Right Symptoms Upbeat, right rotatory nystagmus   ? ?  ?  ? ?  ? ? ? ? ? ? ? ? ? ? ? ? Vestibular Treatment/Exercise - 02/23/22 0001   ? ?  ? Vestibular Treatment/Exercise  ? Vestibular Treatment Provided Canalith Repositioning   ? Canalith Repositioning Epley Manuever Right;Appiani Right   ?  ?  EPLEY MANUEVER RIGHT  ? Number of Reps  2   ? Overall Response Improved Symptoms   ? Response Details  ~3 sec symptoms after 1st Epley maneuver   ?  ? Appiani Right  ? Number of Reps  1   ? Overall  Response  Symptoms Resolved   ? Response Details  Appiani and casani performed   ? ?  ?  ? ?  ? ? ? ? ? ? ? ? ? PT Education - 02/23/22 1228   ? ? Education Details Discussed BPPV, how to check and perform maneuvers. Discussed likely feelings of unsteadiness possible nausea/headaches after canalith repositioning.   ? Person(s) Educated Patient   ? Methods Explanation;Demonstration;Tactile cues;Handout;Verbal cues   ? Comprehension Verbalized understanding;Returned demonstration;Verbal cues required;Tactile cues required;Need further instruction   ? ?  ?  ? ?  ? ? ? PT Short Term Goals - 02/15/22 1442   ? ?  ? PT SHORT TERM GOAL #1  ? Title The patient will be indep with HEP for L shoulder ROM, strength, as well as large amplitude movements for balance/ parkinson's.   ? Time 4   ? Period Weeks   ? Target Date 03/17/22   ?  ? PT SHORT TERM GOAL #2  ? Title The patient will be further assessed for vertigo as indicated and LTG to follow, if needed.   ? Time 4   ? Period Weeks   ?  Target Date 03/17/22   ?  ? PT SHORT TERM GOAL #3  ? Title The patient will reduce L shoulder pain to < or equal to 3/10.   ? Baseline 8/10   ? Time 4   ? Period Weeks   ? Target Date 03/17/22   ?  ? PT SHORT TERM GOAL #4  ? Title The patient will improve L shoulder AROM flexion to 140 degrees with pain < or equal to 2/10.   ? Baseline 118 degrees.   ? Time 4   ? Period Weeks   ? Target Date 03/17/22   ? ?  ?  ? ?  ? ? ? ? PT Long Term Goals - 02/15/22 1444   ? ?  ? PT LONG TERM GOAL #1  ? Title The patient will be indep with HEP progression.   ? Time 8   ? Period Weeks   ? Target Date 04/16/22   ?  ? PT LONG TERM GOAL #2  ? Title The patient will improve mini BEST test from 14/28 up to 18/28 to demo improving balance.   ? Time 8   ? Period Weeks   ? Target Date 04/16/22   ?  ? PT LONG TERM GOAL #3  ? Title The patient will be able to lift 3 lb object to overhead shelf with L UE to demo improved L shoulder strength.   ? Time 8   ? Period Weeks   ? Target Date 04/16/22   ?  ? PT LONG TERM GOAL #4  ? Title The patient will verbalize understanding of post d/c PWR! classes and POP support group for ongoing mgmt of PD.   ? Time 8   ? Period Weeks   ? Target Date 04/16/22   ? ?  ?  ? ?  ? ? ? ? ? ? ? ? Plan - 02/23/22 1222   ? ? Clinical Impression Statement Evaluated pt for BPPV this session -- found pt to be (+) with R Dix-Hallpike and mildly symptomatic with right sidelying testing for horizontal canal BPPV. Performed Epley maneuver and Casani/Appiani maneuver to address it. Discussed BPPV and performing maneuvers at home. Deferred performing his balance and strengthening exercises at this time due to feelings of unsteadiness after maneuvers. Discussed progressing these on  next visit.   ? Personal Factors and Comorbidities Comorbidity 3+   ? Comorbidities h/o arthroscopic shoulder procedures, h/o Parkinson's, diabetes   ? Examination-Activity Limitations Lift;Reach Overhead;Locomotion Level;Squat;Stairs;Stand   ?  Examination-Participation Restrictions Community Activity   ? Stability/Clinical Decision Making Stable/Uncomplicated   ? Rehab Potential Good   ? PT Frequency 2x / week   ? PT Duration 8 weeks   ? PT Treatment/Interventions ADLs/Self Care Home Management;Canalith Repostioning;Vestibular;Patient/family education;Gait training;Stair training;Functional mobility training;Therapeutic activities;Therapeutic exercise;Balance training;Neuromuscular re-education;Manual techniques;Dry needling;Taping;Moist Heat;Iontophoresis 4mg /ml Dexamethasone;Electrical Stimulation   ? PT Next Visit Plan Assess for BPPV and treat accordingly. Add PWR! large amplitude standing twist, standing weight shift, and step.  AAROM L shoulder, L shoulder strengthening (isometrics to begin and progress), add further L shoulder HEP.   ? PT Home Exercise Plan Access Code [email or web address]   ? Consulted and Agree with Plan of Care Patient   ? ?  ?  ? ?  ? ? ?Patient will benefit from skilled therapeutic intervention in order to improve the following deficits and impairments:  Abnormal gait, Decreased activity tolerance, Decreased balance, Decreased mobility, Decreased strength, Postural dysfunction, Decreased range of motion, Increased fascial restricitons, Dizziness ? ?Visit Diagnosis: ?Acute pain of left shoulder ? ?Muscle weakness (generalized) ? ?Other abnormalities of gait and mobility ? ?Unsteadiness on feet ? ?Dizziness and giddiness ? ? ? ? ?Problem List ?Patient Active Problem List  ? Diagnosis Date Noted  ? Chronic left shoulder pain 02/14/2022  ? Gait instability 02/09/2022  ? Parkinsonism (HCC) 02/09/2022  ? Nonrheumatic aortic valve insufficiency 02/09/2022  ? Type 2 diabetes mellitus without complication, without long-term current use of insulin (HCC) 02/09/2022  ? Pure hypercholesterolemia 02/09/2022  ? Depression with anxiety 02/09/2022  ? ? ?Zelene Barga April May, PT, DPT ?02/23/2022, 12:32 PM ? ?[place] [facility] ?[address] ?[place], [place], [place] ?Phone: [phone]   Fax:  [phone] ? ?Name: Marcial Pless ?MRN: Ralph Jensen ?Date of Birth: [birth year] ? ? ? ?

## 2022-02-24 NOTE — Progress Notes (Signed)
? ? ?Assessment/Plan:  ? ? Parkinsonism, by hx ? -Patient does not look parkinsonian today.  Discussed with patient that I am questioning the diagnosis, but it could be possible that the medications that he is on (levodopa) could be covering up the symptoms.  Would like to see the patient off of levodopa and see how he looks. ? -Patient previously apparently had a normal DaTscan in [place].  This would coincide with the examination that we see today. ? -I am going to go ahead and make the patient a follow-up and have him hold his levodopa for about 24 hours before the follow-up and reexamine him.  If he still does not look parkinsonian, then we can definitively say that he does not have Parkinson's disease. ? -going to try and get copy of [place] records to see what thought process/examination was ? ?2.  Diplopia ? -Patient spends a lot of the visit with right eyes shut.  Wife describes fatiguing at the end of the day.  She describes right ptosis at the end of the day.  I think it is important that we start at least looking at the work-up for myasthenia.  We will do acetylcholine receptor antibodies. ? ?3.  Syncope ? -he didn't mention to pcp ? -occurred about a month ago ? -Would recommend that he follow-up with primary care about this.  Patient states that his blood pressure is generally always pretty low.  Given that I am questioning the diagnosis of Parkinson's disease, it is unlikely related. ? ?4.  Diabetic peripheral neuropathy, by history ? -Did not see a lot of physical exam evidence of this today, but patient reports he was previously diagnosed with this by prior neurologist, with EMG testing. ? ?Subjective:  ? ?Ralph Jensen was seen today in the movement disorders clinic for neurologic consultation at the request of Everrett CoombeMatthews, [place], DO.  The consultation is for the evaluation of parkinsonism.  Pt with wife who supplements the history.   ? ?I do not have neurology notes from [place].  I do have his  neuropsychology evaluations.  Within that evaluation, it reports that patient saw nurse practitioner from neurology in October, 2019 with complaints of balance change, word finding difficulties, memory change.  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  Within the neurocognitive testing it is noted that patient has had a normal DaTscan. ? ?Pts wife reports that first seen about 6 years ago and first sx's were memory change and agitation.  They report that he was dx with neuropathy and parkinsonism.   ? ?Current movement disorder medications: ?Carbidopa/levodopa 25/100, 1 tablet 4 times per day ?Donepezil, 10 mg daily ? ? ?Specific Symptoms:  ?Tremor: sometimes at the end of the day but he may at dinner (?rest versus activation); they aren't sure R vs L ?Voice: no change ?Wet Pillows: rarely ?Postural symptoms:  Yes.  , dx with diabetic neuropathy ? Falls?  Yes.  , last fall about 3-4 weeks ago.  He stumbled over tree stump and R arm caught tree; had another fall before this in the basement and he went backwards but he thinks that he "blacked out" ?Bradykinesia symptoms:  pushes off of the chair; slower than he used to be ?Loss of smell:  No. But wife states that he may sometimes smell  something that doesn't exist.  States that he is very sensitive to smell ?Difficulty Swallowing:  No. ?Handwriting, micrographia: Yes.   And more shaky ?Trouble with ADL's:  No. ? Trouble buttoning clothing: No. But may be slow ?Memory changes:  Yes.   patient had neurocognitive testing in [place] in 2019 and November, 2021.  Actually noted improvement in performance from 2019 to 2021.  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 ?Lightheaded:  no ? Syncope: Yes.  , over a month ago.  No warning.  No palpitation.  No lightheaded or dizzy beforehand but "I do always have low blood pressure ?Diplopia:  Yes.   X 1-2 years (worse with tired and in the evening).  Its horizontal.  Better if closes an eye.  Wife states that R eye gets droopy as he gets tired ? ? ?Neuroimaging of the brain has previously been performed (he thinks).  It is not available for my review today. ? ?PREVIOUS MEDICATIONS: Sinemet ? ?ALLERGIES:   ?Allergies  ?Allergen Reactions  ? Bee Venom   ? Fluoxetine   ? Metformin And Related   ? ? ?CURRENT MEDICATIONS:  ?Current Meds  ?Medication Sig  ? atorvastatin (LIPITOR) 10 MG tablet Take 1 tablet (10 mg total) by mouth daily.  ? buPROPion (WELLBUTRIN XL) 300 MG 24 hr tablet Take 1 tablet (300 mg total) by mouth daily.  ? carbidopa-levodopa (SINEMET IR) 25-100 MG tablet Take 1 tablet by mouth 4 (four) times daily.  ? dapagliflozin propanediol (FARXIGA) 5 MG TABS tablet Take 1 tablet (5 mg total) by mouth daily before breakfast.  ? donepezil (ARICEPT) 10 MG tablet Take 1 tablet (10 mg total) by mouth daily.  ? EPINEPHrine 0.3 mg/0.3 mL IJ SOAJ injection Inject 0.3 mg into the muscle as needed for anaphylaxis.  ? tamsulosin (FLOMAX) 0.4 MG CAPS capsule Take 1 capsule (0.4 mg total) by mouth daily.  ?  ? ?Objective:  ? ?VITALS:   ?Vitals:  ? 02/25/22 0949  ?BP: (!) 110/58  ?Pulse: 78  ?SpO2: 97%  ?Weight: 168 lb 9.6 oz (76.5 kg)  ?Height: 5\' 5"  (1.651 m)  ? ? ?GEN:  The patient appears stated age and is in NAD. ?HEENT:  Normocephalic, atraumatic.  The mucous membranes are moist. The superficial temporal arteries are without ropiness or tenderness. ?CV:  RRR ?Lungs:  CTAB ?Neck/HEME:  There are no carotid bruits bilaterally. ? ?Neurological examination: ? ?Orientation: The patient is alert and oriented x3.  ?Cranial nerves: There is good facial  symmetry. He shuts one eye intermittently throughout the examination and when looking at the examiner.  He is able to squeeze both eyes shut and resist eye opening.  There is bilateral ptosis vs pseudoptosis.  Extraocular muscles are intact. The visual fields are full to confrontational testing. The speech is fluent and clear. Soft palate rises symmetrically and there is no tongue deviation. Hearing is intact to conversational tone. ?Sensation: Sensation is intact to light and pinprick throughout (facial, trunk, extremities). Vibration is intact at the bilateral big toe. There is no extinction with double simultaneous stimulation. There is no sensory dermatomal level identified. ?Motor: Strength is 5/5 in the bilateral upper and lower extremities.   Shoulder shrug is equal and symmetric.  There is no pronator drift. ?Deep tendon reflexes: Deep tendon reflexes are 2/4 at the bilateral biceps, triceps,  brachioradialis, patella and achilles. Plantar responses are downgoing bilaterally. ? ?Movement examination: ?Tone: There is nl tone in the bilateral upper extremities.  The tone in the lower extremities is nl.  ?Abnormal movements: none even with distraction  ?Coordination:  There is no decremation with RAM's, with any form of RAMS, including alternating supination and pronation of the forearm, hand opening and closing, finger taps, heel taps and toe taps. ?Gait and Station: The patient has minimal difficulty arising out of a deep-seated chair without the use of the hands. The patient's stride length is normal with slight decreased arm swing on the R.   ?I have reviewed and interpreted the following labs independently ?  Chemistry   ?   ?Component Value Date/Time  ? NA 140 02/09/2022 0000  ? K 4.2 02/09/2022 0000  ? CL 103 02/09/2022 0000  ? CO2 24 02/09/2022 0000  ? BUN 27 (H) 02/09/2022 0000  ? CREATININE 1.10 02/09/2022 0000  ?    ?Component Value Date/Time  ? CALCIUM 10.0 02/09/2022 0000  ? AST 13 02/09/2022 0000   ? ALT 9 02/09/2022 0000  ? BILITOT 1.4 (H) 02/09/2022 0000  ?  ? ? ?No results found for: TSH ?Lab Results  ?Component Value Date  ? WBC 7.2 02/09/2022  ? HGB 17.0 02/09/2022  ? HCT 50.9 (H) 02/09/2022  ?

## 2022-02-25 ENCOUNTER — Ambulatory Visit (INDEPENDENT_AMBULATORY_CARE_PROVIDER_SITE_OTHER): Payer: Medicare Other | Admitting: Neurology

## 2022-02-25 ENCOUNTER — Other Ambulatory Visit (INDEPENDENT_AMBULATORY_CARE_PROVIDER_SITE_OTHER): Payer: Medicare Other

## 2022-02-25 ENCOUNTER — Encounter: Payer: Self-pay | Admitting: Neurology

## 2022-02-25 ENCOUNTER — Ambulatory Visit: Payer: Medicare Other | Admitting: Physical Therapy

## 2022-02-25 VITALS — BP 110/58 | HR 78 | Ht 65.0 in | Wt 168.6 lb

## 2022-02-25 DIAGNOSIS — R55 Syncope and collapse: Secondary | ICD-10-CM | POA: Diagnosis not present

## 2022-02-25 DIAGNOSIS — G20 Parkinson's disease: Secondary | ICD-10-CM

## 2022-02-25 DIAGNOSIS — H02401 Unspecified ptosis of right eyelid: Secondary | ICD-10-CM

## 2022-02-25 DIAGNOSIS — M25512 Pain in left shoulder: Secondary | ICD-10-CM | POA: Diagnosis not present

## 2022-02-25 DIAGNOSIS — R2681 Unsteadiness on feet: Secondary | ICD-10-CM

## 2022-02-25 DIAGNOSIS — E1142 Type 2 diabetes mellitus with diabetic polyneuropathy: Secondary | ICD-10-CM

## 2022-02-25 DIAGNOSIS — H532 Diplopia: Secondary | ICD-10-CM | POA: Diagnosis not present

## 2022-02-25 DIAGNOSIS — R42 Dizziness and giddiness: Secondary | ICD-10-CM

## 2022-02-25 DIAGNOSIS — R2689 Other abnormalities of gait and mobility: Secondary | ICD-10-CM

## 2022-02-25 DIAGNOSIS — M6281 Muscle weakness (generalized): Secondary | ICD-10-CM

## 2022-02-25 NOTE — Patient Instructions (Addendum)
Hold your carbidopa/levodopa 25/100 24 hours before your next visit ? ?Your provider has requested that you have labwork completed today. The lab is located on the Second floor at [place], within the [facility] office. When you get off the elevator, turn right and go in the [facility] [address]; the first brown door on the left.  Tell the ladies behind the desk that you are there for lab work. If you are not called within 15 minutes please check with the front desk.  ? ?Once you complete your labs you are free to go. You will receive a call or message via MyChart with your lab results.   ? ?

## 2022-02-25 NOTE — Therapy (Signed)
[place] [facility] ?[address] [place] [address] ?[place], [place], [address] ?Phone: [phone]   Fax:  [phone] ? ?Physical Therapy Treatment ? ?Patient Details  ?Name: Ralph Jensen ?MRN: [id number] ?Date of Birth: 1946-01-29 ?Referring Provider (PT): Dr. Benjamin Stain (shoulder), Dr. Ashley Royalty (gait) ? ? ?Encounter Date: 02/25/2022 ? ? PT End of Session - 02/25/22 1318   ? ? Visit Number 4   ? Number of Visits 16   ? Date for PT Re-Evaluation 04/16/22   ? Authorization Type medicare   ? Authorization - Visit Number 4   ? Progress Note Due on Visit 10   ? PT Start Time 1319   ? PT Stop Time 1400   ? PT Time Calculation (min) 41 min   ? Activity Tolerance Patient tolerated treatment well   ? Behavior During Therapy [facility] for tasks assessed/performed   ? ?  ?  ? ?  ? ? ?Past Medical History:  ?Diagnosis Date  ? Aortic valve stenosis   ? Cognitive change   ? Diabetic acidosis, type I (HCC)   ? Heart murmur   ? ? ?Past Surgical History:  ?Procedure Laterality Date  ? APPENDECTOMY    ? KIDNEY STONE SURGERY    ? SHOULDER ARTHROSCOPY WITH ROTATOR CUFF REPAIR Left   ? ? ?There were no vitals filed for this visit. ? ? Subjective Assessment - 02/25/22 1321   ? ? Subjective Pt states he had a hard time driving home after last session. He states he read up on BPPV at home and feels ready for canalith repositioning today. Pt's wife drove him and will drive him back.   ? Pertinent History Parkinson's Disease, diabetes.   ? Diagnostic tests Xray ordered yesterday   ? Patient Stated Goals strengthen L shoulder, improve balance   ? Pain Onset More than a month ago   ? ?  ?  ? ?  ? ? ? ? ? ? ? ? ? ? ? ? ? ? ? ? ? ? ? ? [facility] Adult PT Treatment/Exercise - 02/25/22 0001   ? ?  ? Shoulder Exercises: Standing  ? External Rotation Strengthening;Both;20 reps;Theraband   ? Theraband Level (Shoulder External Rotation) Level 2 (Red)   ? Extension Strengthening;Both;20 reps;Theraband   ? Theraband Level  (Shoulder Extension) Level 2 (Red)   ? Row Strengthening;Both;20 reps;Theraband   ? Theraband Level (Shoulder Row) Level 2 (Red)   ? ?  ?  ? ?  ? ? Vestibular Treatment/Exercise - 02/25/22 0001   ? ?  ? Vestibular Treatment/Exercise  ? Canalith Repositioning Epley Manuever Right;Epley Manuever Left   ? Gaze Exercises X1 Viewing Horizontal;X1 Viewing Vertical;Eye/Head Exercise Horizontal;Eye/Head Exercise Vertical   ?  ?  EPLEY MANUEVER RIGHT  ? Number of Reps  1   ? Response Details  --   no nystagmus; performed for demonstration  ?  ?  EPLEY MANUEVER LEFT  ? Number of Reps  1   ?  RESPONSE DETAILS LEFT no nystagmus; performed for demonstration   ?  ? X1 Viewing Horizontal  ? Foot Position Standing feet apart   ? Comments 2x30 sec   ?  ? X1 Viewing Vertical  ? Foot Position Standing feet apart   ? Comments 2x30 sec   ?  ? Eye/Head Exercise Horizontal  ? Foot Position Standing feet apart   ? Comments smooth pursuit & then saccades 2x30 sec   ?  ? Eye/Head Exercise Vertical  ? Foot Position  Standing feet apart   ? Comments smooth pursuit & then saccades 2x30 sec   ? ?  ?  ? ?  ? ? ? ? ? Balance Exercises - 02/25/22 0001   ? ?  ? Balance Exercises: Standing  ? Standing Eyes Closed Wide (BOA);30 secs   head turns and head nods x10 each  ? ?  ?  ? ?  ? ? ? ? ? ? ? PT Short Term Goals - 02/15/22 1442   ? ?  ? PT SHORT TERM GOAL #1  ? Title The patient will be indep with HEP for L shoulder ROM, strength, as well as large amplitude movements for balance/ parkinson's.   ? Time 4   ? Period Weeks   ? Target Date 03/17/22   ?  ? PT SHORT TERM GOAL #2  ? Title The patient will be further assessed for vertigo as indicated and LTG to follow, if needed.   ? Time 4   ? Period Weeks   ? Target Date 03/17/22   ?  ? PT SHORT TERM GOAL #3  ? Title The patient will reduce L shoulder pain to < or equal to 3/10.   ? Baseline 8/10   ? Time 4   ? Period Weeks   ? Target Date 03/17/22   ?  ? PT SHORT TERM GOAL #4  ? Title The patient will  improve L shoulder AROM flexion to 140 degrees with pain < or equal to 2/10.   ? Baseline 118 degrees.   ? Time 4   ? Period Weeks   ? Target Date 03/17/22   ? ?  ?  ? ?  ? ? ? ? PT Long Term Goals - 02/15/22 1444   ? ?  ? PT LONG TERM GOAL #1  ? Title The patient will be indep with HEP progression.   ? Time 8   ? Period Weeks   ? Target Date 04/16/22   ?  ? PT LONG TERM GOAL #2  ? Title The patient will improve mini BEST test from 14/28 up to 18/28 to demo improving balance.   ? Time 8   ? Period Weeks   ? Target Date 04/16/22   ?  ? PT LONG TERM GOAL #3  ? Title The patient will be able to lift 3 lb object to overhead shelf with L UE to demo improved L shoulder strength.   ? Time 8   ? Period Weeks   ? Target Date 04/16/22   ?  ? PT LONG TERM GOAL #4  ? Title The patient will verbalize understanding of post d/c PWR! classes and POP support group for ongoing mgmt of PD.   ? Time 8   ? Period Weeks   ? Target Date 04/16/22   ? ?  ?  ? ?  ? ? ? ? ? ? ? ? Plan - 02/25/22 1402   ? ? Clinical Impression Statement Reviewed home epley maneuver. Pt notes improved symptoms but states as the day goes on he will feel some increased dizziness in the afternoons/evenings. Added gaze stabilization exercises to help address this. Progressed the rest of pt's shoulder strengthening exercises to using red tband. Increased pain with >90 deg flexion/abduction. Pt to perform within pain free range.   ? Personal Factors and Comorbidities Comorbidity 3+   ? Comorbidities h/o arthroscopic shoulder procedures, h/o Parkinson's, diabetes   ? Examination-Activity Limitations Lift;Reach Overhead;Locomotion Level;Squat;Stairs;Stand   ?  Examination-Participation Restrictions Community Activity   ? Stability/Clinical Decision Making Stable/Uncomplicated   ? Rehab Potential Good   ? PT Frequency 2x / week   ? PT Duration 8 weeks   ? PT Treatment/Interventions ADLs/Self Care Home Management;Canalith Repostioning;Vestibular;Patient/family  education;Gait training;Stair training;Functional mobility training;Therapeutic activities;Therapeutic exercise;Balance training;Neuromuscular re-education;Manual techniques;Dry needling;Taping;Moist Heat;Iontophoresis 4mg /ml Dexamethasone;Electrical Stimulation   ? PT Next Visit Plan Assess for BPPV and treat accordingly. Progress vestibular & balancee exercises. Add PWR! large amplitude standing twist, standing weight shift, and step.  L shoulder strengthening progression.   ? PT Home Exercise Plan Access Code [email or web address]   ? Consulted and Agree with Plan of Care Patient   ? ?  ?  ? ?  ? ? ?Patient will benefit from skilled therapeutic intervention in order to improve the following deficits and impairments:  Abnormal gait, Decreased activity tolerance, Decreased balance, Decreased mobility, Decreased strength, Postural dysfunction, Decreased range of motion, Increased fascial restricitons, Dizziness ? ?Visit Diagnosis: ?Acute pain of left shoulder ? ?Muscle weakness (generalized) ? ?Other abnormalities of gait and mobility ? ?Unsteadiness on feet ? ?Dizziness and giddiness ? ? ? ? ?Problem List ?Patient Active Problem List  ? Diagnosis Date Noted  ? Chronic left shoulder pain 02/14/2022  ? Gait instability 02/09/2022  ? Parkinsonism (HCC) 02/09/2022  ? Nonrheumatic aortic valve insufficiency 02/09/2022  ? Type 2 diabetes mellitus without complication, without long-term current use of insulin (HCC) 02/09/2022  ? Pure hypercholesterolemia 02/09/2022  ? Depression with anxiety 02/09/2022  ? ? ?Jaydence Arnesen April Dell PontoMa L Quenna Doepke, PT, DPT ?02/25/2022, 2:05 PM ? ?[place] [facility] ?[address] ?[place], [place], [address] ?Phone: [phone]   Fax:  [phone] ? ?Name: Maggie SchwalbeMichael Porcaro ?MRN: [id number] ?Date of Birth: 11/03/1946 ? ? ? ?

## 2022-02-28 ENCOUNTER — Ambulatory Visit: Payer: Medicare Other | Attending: Sports Medicine | Admitting: Physical Therapy

## 2022-02-28 DIAGNOSIS — R42 Dizziness and giddiness: Secondary | ICD-10-CM | POA: Diagnosis present

## 2022-02-28 DIAGNOSIS — R2689 Other abnormalities of gait and mobility: Secondary | ICD-10-CM | POA: Diagnosis present

## 2022-02-28 DIAGNOSIS — R2681 Unsteadiness on feet: Secondary | ICD-10-CM | POA: Diagnosis present

## 2022-02-28 DIAGNOSIS — M6281 Muscle weakness (generalized): Secondary | ICD-10-CM | POA: Diagnosis present

## 2022-02-28 DIAGNOSIS — M25512 Pain in left shoulder: Secondary | ICD-10-CM | POA: Diagnosis not present

## 2022-02-28 NOTE — Therapy (Signed)
[place] [facility] ?[address] [place] [address] ?[place], [place], [address] ?Phone: [phone]   Fax:  [phone] ? ?Physical Therapy Treatment ? ?Patient Details  ?Name: Ralph Jensen ?MRN: [id number] ?Date of Birth: 1946-11-27 ?Referring Provider (PT): Dr. Benjamin Stain (shoulder), Dr. Ashley Royalty (gait) ? ? ?Encounter Date: 02/28/2022 ? ? PT End of Session - 02/28/22 1148   ? ? Visit Number 5   ? Number of Visits 16   ? Date for PT Re-Evaluation 04/16/22   ? Authorization Type medicare   ? Authorization - Visit Number 5   ? Progress Note Due on Visit 10   ? PT Start Time 1148   ? PT Stop Time 1230   ? PT Time Calculation (min) 42 min   ? Activity Tolerance Patient tolerated treatment well   ? Behavior During Therapy [facility] for tasks assessed/performed   ? ?  ?  ? ?  ? ? ?Past Medical History:  ?Diagnosis Date  ? Aortic valve stenosis   ? Cognitive change   ? Diabetic acidosis, type I (HCC)   ? Heart murmur   ? ? ?Past Surgical History:  ?Procedure Laterality Date  ? APPENDECTOMY    ? KIDNEY STONE SURGERY    ? SHOULDER ARTHROSCOPY WITH ROTATOR CUFF REPAIR Left   ? ? ?There were no vitals filed for this visit. ? ? Subjective Assessment - 02/28/22 1150   ? ? Subjective Pt states he's been doing his card exercises for his balance. Shoulder is sore from using theraband.   ? Pertinent History Parkinson's Disease, diabetes.   ? Diagnostic tests Xray ordered yesterday   ? Patient Stated Goals strengthen L shoulder, improve balance   ? Currently in Pain? Yes   ? Pain Score 6    ? Pain Location Shoulder   ? Pain Orientation Left   ? Pain Onset More than a month ago   ? ?  ?  ? ?  ? ? ? ? ? [facility] PT Assessment - 02/28/22 0001   ? ?  ? Assessment  ? Medical Diagnosis gait instability and chronic L shoulder pain   ? Referring Provider (PT) Dr. Benjamin Stain (shoulder), Dr. Ashley Royalty (gait)   ? Onset Date/Surgical Date 02/09/22   ? Hand Dominance Right   ? Prior Therapy h/o PT for balance,  vestibular rehab in 2021 and 2022 (he reports "every 12-18 months I need to go through therapy")   ? ?  ?  ? ?  ? ? ? ? ? ? Vestibular Assessment - 02/28/22 0001   ? ?  ? Positional Testing  ? Sidelying Test Sidelying Right   ?  ? Sidelying Right  ? Sidelying Right Duration <5 sec   ? Sidelying Right Symptoms --   Unable to visualize; pt reports "wavering"  ? ?  ?  ? ?  ? ? ? ? ? ? ? ? ? ? ? [facility] Adult PT Treatment/Exercise - 02/28/22 0001   ? ?  ? Lumbar Exercises: Aerobic  ? Nustep L6 x 5 min UEs/LEs   ?  ? Shoulder Exercises: Sidelying  ? External Rotation Strengthening;Right;Left;20 reps;Weights   ? External Rotation Weight (lbs) 2   ? Flexion Strengthening;20 reps;Right;Left   ? ABduction Strengthening;Right;Left;20 reps   posterior shoulder pinching noted at end range; decreased after providing manual assist  ? Other Sidelying Exercises horizontal abduction x10   ? Other Sidelying Exercises scapular depression + retraction x20   ? ?  ?  ? ?  ? ?  Vestibular Treatment/Exercise - 02/28/22 0001   ? ?  ? Appiani Right  ? Number of Reps  1   ? Overall Response  Symptoms Resolved   ? Response Details  Appiani and casani performed   ? ?  ?  ? ?  ? ? ? ? ? Balance Exercises - 02/28/22 0001   ? ?  ? Balance Exercises: Standing  ? Tandem Stance Eyes open;2 reps;30 secs   ? SLS with Vectors Solid surface   2x10 reps tap forward on 1st 4" step, tap forward on 1st and 2nd 4" step, lateral taps on cone  ? ?  ?  ? ?  ? ? ? ? ? ? ? PT Short Term Goals - 02/15/22 1442   ? ?  ? PT SHORT TERM GOAL #1  ? Title The patient will be indep with HEP for L shoulder ROM, strength, as well as large amplitude movements for balance/ parkinson's.   ? Time 4   ? Period Weeks   ? Target Date 03/17/22   ?  ? PT SHORT TERM GOAL #2  ? Title The patient will be further assessed for vertigo as indicated and LTG to follow, if needed.   ? Time 4   ? Period Weeks   ? Target Date 03/17/22   ?  ? PT SHORT TERM GOAL #3  ? Title The patient will reduce L  shoulder pain to < or equal to 3/10.   ? Baseline 8/10   ? Time 4   ? Period Weeks   ? Target Date 03/17/22   ?  ? PT SHORT TERM GOAL #4  ? Title The patient will improve L shoulder AROM flexion to 140 degrees with pain < or equal to 2/10.   ? Baseline 118 degrees.   ? Time 4   ? Period Weeks   ? Target Date 03/17/22   ? ?  ?  ? ?  ? ? ? ? PT Long Term Goals - 02/15/22 1444   ? ?  ? PT LONG TERM GOAL #1  ? Title The patient will be indep with HEP progression.   ? Time 8   ? Period Weeks   ? Target Date 04/16/22   ?  ? PT LONG TERM GOAL #2  ? Title The patient will improve mini BEST test from 14/28 up to 18/28 to demo improving balance.   ? Time 8   ? Period Weeks   ? Target Date 04/16/22   ?  ? PT LONG TERM GOAL #3  ? Title The patient will be able to lift 3 lb object to overhead shelf with L UE to demo improved L shoulder strength.   ? Time 8   ? Period Weeks   ? Target Date 04/16/22   ?  ? PT LONG TERM GOAL #4  ? Title The patient will verbalize understanding of post d/c PWR! classes and POP support group for ongoing mgmt of PD.   ? Time 8   ? Period Weeks   ? Target Date 04/16/22   ? ?  ?  ? ?  ? ? ? ? ? ? ? ? ? ?Patient will benefit from skilled therapeutic intervention in order to improve the following deficits and impairments:    ? ?Visit Diagnosis: ?Acute pain of left shoulder ? ?Muscle weakness (generalized) ? ?Other abnormalities of gait and mobility ? ?Unsteadiness on feet ? ?Dizziness and giddiness ? ? ? ? ?Problem List ?Patient  Active Problem List  ? Diagnosis Date Noted  ? Chronic left shoulder pain 02/14/2022  ? Gait instability 02/09/2022  ? Parkinsonism (HCC) 02/09/2022  ? Nonrheumatic aortic valve insufficiency 02/09/2022  ? Type 2 diabetes mellitus without complication, without long-term current use of insulin (HCC) 02/09/2022  ? Pure hypercholesterolemia 02/09/2022  ? Depression with anxiety 02/09/2022  ? ? ?Nalina Yeatman April Dell PontoMa L Judye Lorino, PT, DPT ?02/28/2022, 12:32 PM ? ?[place] [facility] ?[address] ?[place], [place], [address] ?Phone: [phone]   Fax:  [phone] ? ?Name: Ralph SchwalbeMichael Jensen ?MRN: [id number] ?Date of Birth: 11/29/1945 ? ? ? ?

## 2022-03-01 ENCOUNTER — Ambulatory Visit (HOSPITAL_BASED_OUTPATIENT_CLINIC_OR_DEPARTMENT_OTHER)
Admission: RE | Admit: 2022-03-01 | Discharge: 2022-03-01 | Disposition: A | Discharge status: Home / Self Care | Payer: Medicare Other | Source: Ambulatory Visit | Attending: Family Medicine | Admitting: Family Medicine

## 2022-03-01 DIAGNOSIS — I351 Nonrheumatic aortic (valve) insufficiency: Secondary | ICD-10-CM | POA: Diagnosis present

## 2022-03-01 LAB — ECHOCARDIOGRAM COMPLETE
AR max vel: 1.47 cm2
AV Area VTI: 1.47 cm2
AV Area mean vel: 1.33 cm2
AV Mean grad: 6 mmHg
AV Peak grad: 10.1 mmHg
Ao pk vel: 1.59 m/s
Area-P 1/2: 2.97 cm2
S' Lateral: 2.7 cm

## 2022-03-01 NOTE — Progress Notes (Signed)
?  Echocardiogram ?2D Echocardiogram has been performed. ? ?Ralph Jensen ?03/01/2022, 11:25 AM ?

## 2022-03-03 ENCOUNTER — Ambulatory Visit: Payer: Medicare Other | Admitting: Physical Therapy

## 2022-03-03 DIAGNOSIS — M25512 Pain in left shoulder: Secondary | ICD-10-CM

## 2022-03-03 DIAGNOSIS — R2681 Unsteadiness on feet: Secondary | ICD-10-CM

## 2022-03-03 DIAGNOSIS — M6281 Muscle weakness (generalized): Secondary | ICD-10-CM

## 2022-03-03 DIAGNOSIS — R2689 Other abnormalities of gait and mobility: Secondary | ICD-10-CM

## 2022-03-03 DIAGNOSIS — R42 Dizziness and giddiness: Secondary | ICD-10-CM

## 2022-03-03 NOTE — Therapy (Signed)
[place] ?[facility] ?[address] ?[place], [place], [address] ?Phone: [phone]   Fax:  [phone] ? ?Physical Therapy Treatment ? ?Patient Details  ?Name: Ralph Jensen ?MRN: [id number] ?Date of Birth: 1946-02-10 ?Referring Provider (PT): Dr. Dianah Field (shoulder), Dr. Zigmund Daniel (gait) ? ? ?Encounter Date: 03/03/2022 ? ? PT End of Session - 03/03/22 1147   ? ? Visit Number 6   ? Number of Visits 16   ? Date for PT Re-Evaluation 04/16/22   ? Authorization Type medicare   ? Authorization - Visit Number 6   ? Progress Note Due on Visit 10   ? PT Start Time 1148   ? PT Stop Time 1230   ? PT Time Calculation (min) 42 min   ? Activity Tolerance Patient tolerated treatment well   ? Behavior During Therapy [facility] for tasks assessed/performed   ? ?  ?  ? ?  ? ? ?Past Medical History:  ?Diagnosis Date  ? Aortic valve stenosis   ? Cognitive change   ? Diabetic acidosis, type I ([place])   ? Heart murmur   ? ? ?Past Surgical History:  ?Procedure Laterality Date  ? APPENDECTOMY    ? KIDNEY STONE SURGERY    ? SHOULDER ARTHROSCOPY WITH ROTATOR CUFF REPAIR Left   ? ? ?There were no vitals filed for this visit. ? ? Subjective Assessment - 03/03/22 1151   ? ? Subjective Shoulder is doing okay. Pt reports some leg muscles that were sore after last session.   ? Pertinent History Parkinson's Disease, diabetes.   ? Diagnostic tests Xray ordered yesterday   ? Patient Stated Goals strengthen L shoulder, improve balance   ? Pain Onset More than a month ago   ? ?  ?  ? ?  ? ? ? ? ? [facility] PT Assessment - 03/03/22 0001   ? ?  ? Assessment  ? Medical Diagnosis gait instability and chronic L shoulder pain   ? Referring Provider (PT) Dr. Dianah Field (shoulder), Dr. Zigmund Daniel (gait)   ? Onset Date/Surgical Date 02/09/22   ? Hand Dominance Right   ? Prior Therapy h/o PT for balance, vestibular rehab in 2021 and 2022 (he reports "every 12-18 months I need to go through therapy")   ? ?  ?  ? ?   ? ? ? ? ? ? ? ? ? ? ? ? ? ? ? ? [facility] Adult PT Treatment/Exercise - 03/03/22 0001   ? ?  ? Lumbar Exercises: Aerobic  ? Nustep L6 x 5 min UEs/LEs   ? ?  ?  ? ?  ? ? ? ? ? ? Balance Exercises - 03/03/22 0001   ? ?  ? Balance Exercises: Standing  ? Standing Eyes Opened Narrow base of support (BOS);Head turns;Foam/compliant surface;30 secs;2 reps (P)    ? Standing Eyes Closed 30 secs;Narrow base of support (BOS);Foam/compliant surface (P)    ? SLS with Vectors Solid surface (P)    2x10 reps tap forward on 1st 4" step, tap forward on 1st and 2nd 4" step, lateral taps on cone  ? Tandem Gait Forward;2 reps (P)    next to plinth  ? Other Standing Exercises step over 3 pool noodles: Forward step overs x3 reps with 10 sec hold, side stepping x 3 reps x 10 sec hold; backwards step overs x3 reps x 10 sec hold (P)    ? ?  ?  ? ?  ? ? ? ? ? ? ?  PT Short Term Goals - 02/15/22 1442   ? ?  ? PT SHORT TERM GOAL #1  ? Title The patient will be indep with HEP for L shoulder ROM, strength, as well as large amplitude movements for balance/ parkinson's.   ? Time 4   ? Period Weeks   ? Target Date 03/17/22   ?  ? PT SHORT TERM GOAL #2  ? Title The patient will be further assessed for vertigo as indicated and LTG to follow, if needed.   ? Time 4   ? Period Weeks   ? Target Date 03/17/22   ?  ? PT SHORT TERM GOAL #3  ? Title The patient will reduce L shoulder pain to < or equal to 3/10.   ? Baseline 8/10   ? Time 4   ? Period Weeks   ? Target Date 03/17/22   ?  ? PT SHORT TERM GOAL #4  ? Title The patient will improve L shoulder AROM flexion to 140 degrees with pain < or equal to 2/10.   ? Baseline 118 degrees.   ? Time 4   ? Period Weeks   ? Target Date 03/17/22   ? ?  ?  ? ?  ? ? ? ? PT Long Term Goals - 02/15/22 1444   ? ?  ? PT LONG TERM GOAL #1  ? Title The patient will be indep with HEP progression.   ? Time 8   ? Period Weeks   ? Target Date 04/16/22   ?  ? PT LONG TERM GOAL #2  ? Title The patient will improve mini BEST test from  14/28 up to 18/28 to demo improving balance.   ? Time 8   ? Period Weeks   ? Target Date 04/16/22   ?  ? PT LONG TERM GOAL #3  ? Title The patient will be able to lift 3 lb object to overhead shelf with L UE to demo improved L shoulder strength.   ? Time 8   ? Period Weeks   ? Target Date 04/16/22   ?  ? PT LONG TERM GOAL #4  ? Title The patient will verbalize understanding of post d/c PWR! classes and POP support group for ongoing mgmt of PD.   ? Time 8   ? Period Weeks   ? Target Date 04/16/22   ? ?  ?  ? ?  ? ? ? ? ? ? ? ? Plan - 03/03/22 1359   ? ? Clinical Impression Statement Treatment session focused primarily on pt's balance and making large amplitude movements. Greatest difficulty with backwards stepping and tandem gait.  Hips fatigued quickly. Worked on improving single leg stability and balance on foam.   ? Personal Factors and Comorbidities Comorbidity 3+   ? Comorbidities h/o arthroscopic shoulder procedures, h/o Parkinson's, diabetes   ? Examination-Activity Limitations Lift;Reach Overhead;Locomotion Level;Squat;Stairs;Stand   ? Examination-Participation Restrictions Community Activity   ? Stability/Clinical Decision Making Stable/Uncomplicated   ? Rehab Potential Good   ? PT Frequency 2x / week   ? PT Duration 8 weeks   ? PT Treatment/Interventions ADLs/Self Care Home Management;Canalith Repostioning;Vestibular;Patient/family education;Gait training;Stair training;Functional mobility training;Therapeutic activities;Therapeutic exercise;Balance training;Neuromuscular re-education;Manual techniques;Dry needling;Taping;Moist Heat;Iontophoresis 4mg /ml Dexamethasone;Electrical Stimulation   ? PT Next Visit Plan Assess for BPPV and treat accordingly. Progress vestibular & balancee exercises. Add PWR! large amplitude standing twist, standing weight shift, and step.  L shoulder strengthening progression.   ? PT Home Exercise Plan Access  Code [id number]   ? Consulted and Agree with Plan of Care Patient   ? ?  ?   ? ?  ? ? ?Patient will benefit from skilled therapeutic intervention in order to improve the following deficits and impairments:  Abnormal gait, Decreased activity tolerance, Decreased balance, Decreased mobility, Decreased strength, Postural dysfunction, Decreased range of motion, Increased fascial restricitons, Dizziness ? ?Visit Diagnosis: ?Acute pain of left shoulder ? ?Muscle weakness (generalized) ? ?Other abnormalities of gait and mobility ? ?Unsteadiness on feet ? ?Dizziness and giddiness ? ? ? ? ?Problem List ?Patient Active Problem List  ? Diagnosis Date Noted  ? Chronic left shoulder pain 02/14/2022  ? Gait instability 02/09/2022  ? Parkinsonism ([place]) 02/09/2022  ? Nonrheumatic aortic valve insufficiency 02/09/2022  ? Type 2 diabetes mellitus without complication, without long-term current use of insulin ([place]) 02/09/2022  ? Pure hypercholesterolemia 02/09/2022  ? Depression with anxiety 02/09/2022  ? ? ?Dmarion Perfect April Gordy Levan, PT, DPT ?03/03/2022, 2:02 PM ? ?[place] ?[facility] ?[address] ?[place], [place], [address] ?Phone: [phone]   Fax:  [phone] ? ?Name: Ralph Jensen ?MRN: [id number] ?Date of Birth: 12/20/1945 ? ? ? ?

## 2022-03-08 ENCOUNTER — Ambulatory Visit: Payer: Medicare Other | Admitting: Physical Therapy

## 2022-03-08 DIAGNOSIS — R42 Dizziness and giddiness: Secondary | ICD-10-CM

## 2022-03-08 DIAGNOSIS — R2689 Other abnormalities of gait and mobility: Secondary | ICD-10-CM

## 2022-03-08 DIAGNOSIS — M25512 Pain in left shoulder: Secondary | ICD-10-CM | POA: Diagnosis not present

## 2022-03-08 DIAGNOSIS — R2681 Unsteadiness on feet: Secondary | ICD-10-CM

## 2022-03-08 DIAGNOSIS — M6281 Muscle weakness (generalized): Secondary | ICD-10-CM

## 2022-03-08 NOTE — Therapy (Signed)
[facility] ?[address] ?[place], [place], [address] ?Phone: [phone]   Fax:  [phone] ? ?Physical Therapy Treatment ? ?Patient Details  ?Name: Ralph SchwalbeMichael Jensen ?MRN: [id number] ?Date of Birth: 06/16/1946 ?Referring Provider (PT): Dr. Benjamin Stainhekkekandam (shoulder), Dr. Ashley RoyaltyMatthews (gait) ? ? ?Encounter Date: 03/08/2022 ? ? PT End of Session - 03/08/22 1145   ? ? Visit Number 7   ? Number of Visits 16   ? Date for PT Re-Evaluation 04/16/22   ? Authorization Type medicare   ? Authorization - Visit Number 7   ? Progress Note Due on Visit 10   ? PT Start Time 1145   ? PT Stop Time 1230   ? PT Time Calculation (min) 45 min   ? Activity Tolerance Patient tolerated treatment well   ? [facility] for tasks assessed/performed   ? ?  ?  ? ?  ? ? ?Past Medical History:  ?Diagnosis Date  ? Aortic valve stenosis   ? Cognitive change   ? Diabetic acidosis, type I (HCC)   ? Heart murmur   ? ? ?Past Surgical History:  ?Procedure Laterality Date  ? APPENDECTOMY    ? KIDNEY STONE SURGERY    ? SHOULDER ARTHROSCOPY WITH ROTATOR CUFF REPAIR Left   ? ? ?There were no vitals filed for this visit. ? ? Subjective Assessment - 03/08/22 1220   ? ? Subjective Pt reports some increased shoulder soreness today. He states he did do his shoulder exercises yesterday.   ? Pertinent History Parkinson's Disease, diabetes.   ? Diagnostic tests Xray ordered yesterday   ? Patient Stated Goals strengthen L shoulder, improve balance   ? Currently in Pain? Yes   ? Pain Score 6    ? Pain Location Shoulder   ? Pain Orientation Left   ? Pain Descriptors / Indicators Aching;Discomfort;Sore   ? Pain Onset More than a month ago   ? ?  ?  ? ?  ? ? ? ? ? [facility] PT Assessment - 03/08/22 0001   ? ?  ? Assessment  ? Medical Diagnosis gait instability and chronic L shoulder pain   ? Referring Provider (PT) Dr. Benjamin Stainhekkekandam (shoulder), Dr. Ashley RoyaltyMatthews (gait)   ? Onset Date/Surgical Date 02/09/22   ?  Hand Dominance Right   ? Prior Therapy h/o PT for balance, vestibular rehab in 2021 and 2022 (he reports "every 12-18 months I need to go through therapy")   ? ?  ?  ? ?  ? ? ? ? ? ? ? ? ? ? ? ? ? ? ? ? [facility] Adult PT Treatment/Exercise - 03/08/22 0001   ? ?  ? Shoulder Exercises: Supine  ? External Rotation AROM;Both;10 reps   ?  ? Shoulder Exercises: Sidelying  ? Flexion Strengthening;20 reps;Right;Left   ? ABduction Strengthening;Right;Left;20 reps   ? Other Sidelying Exercises horizontal abduction x10   ? Other Sidelying Exercises scapular depression + retraction x20   ?  ? Shoulder Exercises: Stretch  ? Other Shoulder Stretches Shoulder rolls x20\   ? Other Shoulder Stretches Doorway stretch low, mid and high x30 sec each   ?  ? Manual Therapy  ? Manual Therapy Passive ROM;Soft tissue mobilization;Joint mobilization   ? Manual therapy comments gentle grade II humeral distraction   ? Joint Mobilization scapular mobs, grade II inferior GH mobs   ? Soft tissue mobilization STM & TPR L pecs, lats and UT   ? Passive ROM Shoulder  flex, abd   ? ?  ?  ? ?  ? ? ? ? ? ? ? ? ? ? ? ? PT Short Term Goals - 02/15/22 1442   ? ?  ? PT SHORT TERM GOAL #1  ? Title The patient will be indep with HEP for L shoulder ROM, strength, as well as large amplitude movements for balance/ parkinson's.   ? Time 4   ? Period Weeks   ? Target Date 03/17/22   ?  ? PT SHORT TERM GOAL #2  ? Title The patient will be further assessed for vertigo as indicated and LTG to follow, if needed.   ? Time 4   ? Period Weeks   ? Target Date 03/17/22   ?  ? PT SHORT TERM GOAL #3  ? Title The patient will reduce L shoulder pain to < or equal to 3/10.   ? Baseline 8/10   ? Time 4   ? Period Weeks   ? Target Date 03/17/22   ?  ? PT SHORT TERM GOAL #4  ? Title The patient will improve L shoulder AROM flexion to 140 degrees with pain < or equal to 2/10.   ? Baseline 118 degrees.   ? Time 4   ? Period Weeks   ? Target Date 03/17/22   ? ?  ?  ? ?  ? ? ? ? PT Long  Term Goals - 02/15/22 1444   ? ?  ? PT LONG TERM GOAL #1  ? Title The patient will be indep with HEP progression.   ? Time 8   ? Period Weeks   ? Target Date 04/16/22   ?  ? PT LONG TERM GOAL #2  ? Title The patient will improve mini BEST test from 14/28 up to 18/28 to demo improving balance.   ? Time 8   ? Period Weeks   ? Target Date 04/16/22   ?  ? PT LONG TERM GOAL #3  ? Title The patient will be able to lift 3 lb object to overhead shelf with L UE to demo improved L shoulder strength.   ? Time 8   ? Period Weeks   ? Target Date 04/16/22   ?  ? PT LONG TERM GOAL #4  ? Title The patient will verbalize understanding of post d/c PWR! classes and POP support group for ongoing mgmt of PD.   ? Time 8   ? Period Weeks   ? Target Date 04/16/22   ? ?  ?  ? ?  ? ? ? ? ? ? ? ? Plan - 03/08/22 1222   ? ? Clinical Impression Statement This session focused primarily on addressing pt's L shoulder. Provided manual work and modified his current HEP to reduce pec and upper trap overactivity.   ? Personal Factors and Comorbidities Comorbidity 3+   ? Comorbidities h/o arthroscopic shoulder procedures, h/o Parkinson's, diabetes   ? Examination-Activity Limitations Lift;Reach Overhead;Locomotion Level;Squat;Stairs;Stand   ? Examination-Participation Restrictions Community Activity   ? Stability/Clinical Decision Making Stable/Uncomplicated   ? Rehab Potential Good   ? PT Frequency 2x / week   ? PT Duration 8 weeks   ? PT Treatment/Interventions ADLs/Self Care Home Management;Canalith Repostioning;Vestibular;Patient/family education;Gait training;Stair training;Functional mobility training;Therapeutic activities;Therapeutic exercise;Balance training;Neuromuscular re-education;Manual techniques;Dry needling;Taping;Moist Heat;Iontophoresis 4mg /ml Dexamethasone;Electrical Stimulation   ? PT Next Visit Plan Assess for BPPV and treat accordingly. Progress vestibular & balancee exercises. Add PWR! large amplitude standing twist, standing  weight shift,  and step.  L shoulder strengthening progression.   ? PT Home Exercise Plan Access Code [email or web address]   ? Consulted and Agree with Plan of Care Patient   ? ?  ?  ? ?  ? ? ?Patient will benefit from skilled therapeutic intervention in order to improve the following deficits and impairments:  Abnormal gait, Decreased activity tolerance, Decreased balance, Decreased mobility, Decreased strength, Postural dysfunction, Decreased range of motion, Increased fascial restricitons, Dizziness ? ?Visit Diagnosis: ?Acute pain of left shoulder ? ?Muscle weakness (generalized) ? ?Other abnormalities of gait and mobility ? ?Unsteadiness on feet ? ?Dizziness and giddiness ? ? ? ? ?Problem List ?Patient Active Problem List  ? Diagnosis Date Noted  ? Chronic left shoulder pain 02/14/2022  ? Gait instability 02/09/2022  ? Parkinsonism (HCC) 02/09/2022  ? Nonrheumatic aortic valve insufficiency 02/09/2022  ? Type 2 diabetes mellitus without complication, without long-term current use of insulin (HCC) 02/09/2022  ? Pure hypercholesterolemia 02/09/2022  ? Depression with anxiety 02/09/2022  ? ? ?Peggi Yono April Dell Ponto, PT, DPT ?03/08/2022, 12:24 PM ? ?[facility] ?[address] ?[place], [place], [address] ?Phone: [phone]   Fax:  [phone] ? ?Name: Ralph Jensen ?MRN: [id number] ?Date of Birth: Nov 10, 1946 ? ? ? ?

## 2022-03-11 ENCOUNTER — Ambulatory Visit: Payer: Medicare Other | Admitting: Physical Therapy

## 2022-03-11 DIAGNOSIS — M25512 Pain in left shoulder: Secondary | ICD-10-CM

## 2022-03-11 DIAGNOSIS — M6281 Muscle weakness (generalized): Secondary | ICD-10-CM

## 2022-03-11 DIAGNOSIS — R42 Dizziness and giddiness: Secondary | ICD-10-CM

## 2022-03-11 DIAGNOSIS — R2681 Unsteadiness on feet: Secondary | ICD-10-CM

## 2022-03-11 DIAGNOSIS — R2689 Other abnormalities of gait and mobility: Secondary | ICD-10-CM

## 2022-03-11 LAB — MYASTHENIA GRAVIS PANEL 2
A CHR BINDING ABS: <0.3 nmol/L
ACHR Blocking Abs: <15 % Inhibition (ref <15)
Acetylchol Modul Ab: <1 % Inhibition

## 2022-03-11 NOTE — Therapy (Signed)
[facility] ?[address] ?[place], [place], [address] ?Phone: [phone]   Fax:  [phone] ? ?Physical Therapy Treatment ? ?Patient Details  ?Name: Ralph Jensen ?MRN: [id number] ?Date of Birth: 11/18/1946 ?Referring Provider (PT): Dr. Benjamin Stain (shoulder), Dr. Ashley Royalty (gait) ? ? ?Encounter Date: 03/11/2022 ? ? PT End of Session - 03/11/22 1155   ? ? Visit Number 8   ? Number of Visits 16   ? Date for PT Re-Evaluation 04/16/22   ? Authorization Type medicare   ? Authorization - Visit Number 8   ? Progress Note Due on Visit 10   ? PT Start Time 1147   ? PT Stop Time 1230   ? PT Time Calculation (min) 43 min   ? Activity Tolerance Patient tolerated treatment well   ? Behavior During Therapy [facility] for tasks assessed/performed   ? ?  ?  ? ?  ? ? ?Past Medical History:  ?Diagnosis Date  ? Aortic valve stenosis   ? Cognitive change   ? Diabetic acidosis, type I (HCC)   ? Heart murmur   ? ? ?Past Surgical History:  ?Procedure Laterality Date  ? APPENDECTOMY    ? KIDNEY STONE SURGERY    ? SHOULDER ARTHROSCOPY WITH ROTATOR CUFF REPAIR Left   ? ? ?There were no vitals filed for this visit. ? ? Subjective Assessment - 03/11/22 1243   ? ? Subjective Reports shoulder is feeling better today. He notes a bad episode of dizziness yesterday when he woke up out of bed. States it is feeling better today.   ? Pertinent History Parkinson's Disease, diabetes.   ? Diagnostic tests Xray ordered yesterday   ? Patient Stated Goals strengthen L shoulder, improve balance   ? Currently in Pain? No/denies   ? Pain Onset More than a month ago   ? ?  ?  ? ?  ? ? ? ? ? ? ? ? ? ? Vestibular Assessment - 03/11/22 0001   ? ?  ? Positional Testing  ? Horizontal Canal Testing Horizontal Canal Right;Horizontal Canal Left   ?  ? Horizontal Canal Right  ? Horizontal Canal Right Duration 37   ? Horizontal Canal Right Symptoms Ageotrophic   ?  ? Horizontal Canal Left  ? Horizontal Canal Left  Duration ~25 sec   ? Horizontal Canal Left Symptoms Geotrophic   ? ?  ?  ? ?  ? ? ? ? ? ? ? ? ? ? ? ? Vestibular Treatment/Exercise - 03/11/22 0001   ? ?  ? Vestibular Treatment/Exercise  ? Canalith Repositioning Canal Roll Right;Canal Roll Left   ?  ? Canal Roll Right  ? Number of Reps  1   ? Overall Response  Symptoms Resolved   ?  ? Canal Roll Left  ? Number of Reps  1   ? Overall Response  Symptoms Resolved   ?  ? Appiani Right  ? Number of Reps  2   ? Overall Response  Symptoms Resolved   ? Response Details  Appiani and Guffoni   ? ?  ?  ? ?  ? ? ? ? ? Balance Exercises - 03/11/22 0001   ? ?  ? Balance Exercises: Standing  ? Retro Gait 2 reps   ? Sidestepping 2 reps   L<>R with large steps and arm movement  ? Other Standing Exercises Large forward stepping x2 laps around gym   ? ?  ?  ? ?  ? ? ? ? ?  PT Education - 03/11/22 1244   ? ? Education Details Reinfored how to perform self Epley at home if he finds dizziness in position for posterior canalithiasis   ? Person(s) Educated Patient   ? Methods Explanation;Demonstration;Tactile cues;Verbal cues;Handout   ? Comprehension Verbalized understanding;Returned demonstration;Tactile cues required;Verbal cues required   ? ?  ?  ? ?  ? ? ? PT Short Term Goals - 02/15/22 1442   ? ?  ? PT SHORT TERM GOAL #1  ? Title The patient will be indep with HEP for L shoulder ROM, strength, as well as large amplitude movements for balance/ parkinson's.   ? Time 4   ? Period Weeks   ? Target Date 03/17/22   ?  ? PT SHORT TERM GOAL #2  ? Title The patient will be further assessed for vertigo as indicated and LTG to follow, if needed.   ? Time 4   ? Period Weeks   ? Target Date 03/17/22   ?  ? PT SHORT TERM GOAL #3  ? Title The patient will reduce L shoulder pain to < or equal to 3/10.   ? Baseline 8/10   ? Time 4   ? Period Weeks   ? Target Date 03/17/22   ?  ? PT SHORT TERM GOAL #4  ? Title The patient will improve L shoulder AROM flexion to 140 degrees with pain < or equal to  2/10.   ? Baseline 118 degrees.   ? Time 4   ? Period Weeks   ? Target Date 03/17/22   ? ?  ?  ? ?  ? ? ? ? PT Long Term Goals - 02/15/22 1444   ? ?  ? PT LONG TERM GOAL #1  ? Title The patient will be indep with HEP progression.   ? Time 8   ? Period Weeks   ? Target Date 04/16/22   ?  ? PT LONG TERM GOAL #2  ? Title The patient will improve mini BEST test from 14/28 up to 18/28 to demo improving balance.   ? Time 8   ? Period Weeks   ? Target Date 04/16/22   ?  ? PT LONG TERM GOAL #3  ? Title The patient will be able to lift 3 lb object to overhead shelf with L UE to demo improved L shoulder strength.   ? Time 8   ? Period Weeks   ? Target Date 04/16/22   ?  ? PT LONG TERM GOAL #4  ? Title The patient will verbalize understanding of post d/c PWR! classes and POP support group for ongoing mgmt of PD.   ? Time 8   ? Period Weeks   ? Target Date 04/16/22   ? ?  ?  ? ?  ? ? ? ? ? ? ? ? Plan - 03/11/22 1232   ? ? Clinical Impression Statement Treatment focused on addressing pt's dizziness. Assessment significant for R horizontal cupulolithiasis and L horizontal canalithiasis. Symptoms resolved after canalith repositioning. Discussed with pt checking posterior canalith at home and performing Epley maneuver as needed. Rest of session worked on dynamic gait.   ? Personal Factors and Comorbidities Comorbidity 3+   ? Comorbidities h/o arthroscopic shoulder procedures, h/o Parkinson's, diabetes   ? Examination-Activity Limitations Lift;Reach Overhead;Locomotion Level;Squat;Stairs;Stand   ? Examination-Participation Restrictions Community Activity   ? Stability/Clinical Decision Making Stable/Uncomplicated   ? Rehab Potential Good   ? PT Frequency 2x / week   ?  PT Duration 8 weeks   ? PT Treatment/Interventions ADLs/Self Care Home Management;Canalith Repostioning;Vestibular;Patient/family education;Gait training;Stair training;Functional mobility training;Therapeutic activities;Therapeutic exercise;Balance  training;Neuromuscular re-education;Manual techniques;Dry needling;Taping;Moist Heat;Iontophoresis 4mg /ml Dexamethasone;Electrical Stimulation   ? PT Next Visit Plan Assess for BPPV and treat accordingly. Progress vestibular & balancee exercises. Add PWR! large amplitude standing twist, standing weight shift, and step.  L shoulder strengthening progression.   ? PT Home Exercise Plan Access Code [email or web address]   ? Consulted and Agree with Plan of Care Patient   ? ?  ?  ? ?  ? ? ?Patient will benefit from skilled therapeutic intervention in order to improve the following deficits and impairments:  Abnormal gait, Decreased activity tolerance, Decreased balance, Decreased mobility, Decreased strength, Postural dysfunction, Decreased range of motion, Increased fascial restricitons, Dizziness ? ?Visit Diagnosis: ?Acute pain of left shoulder ? ?Muscle weakness (generalized) ? ?Other abnormalities of gait and mobility ? ?Unsteadiness on feet ? ?Dizziness and giddiness ? ? ? ? ?Problem List ?Patient Active Problem List  ? Diagnosis Date Noted  ? Chronic left shoulder pain 02/14/2022  ? Gait instability 02/09/2022  ? Parkinsonism (HCC) 02/09/2022  ? Nonrheumatic aortic valve insufficiency 02/09/2022  ? Type 2 diabetes mellitus without complication, without long-term current use of insulin (HCC) 02/09/2022  ? Pure hypercholesterolemia 02/09/2022  ? Depression with anxiety 02/09/2022  ? ? ?Kenzel Ruesch April May, PT, DPT ?03/11/2022, 12:46 PM ? ?[facility] ?[address] ?[place], [place], [place] ?Phone: [phone]   Fax:  [phone] ? ?Name: Ralph Jensen ?MRN: Maggie Schwalbe ?Date of Birth: 08-27-1946 ? ? ? ?

## 2022-03-16 ENCOUNTER — Ambulatory Visit: Payer: Medicare Other | Admitting: Physical Therapy

## 2022-03-16 DIAGNOSIS — M25512 Pain in left shoulder: Secondary | ICD-10-CM

## 2022-03-16 DIAGNOSIS — M6281 Muscle weakness (generalized): Secondary | ICD-10-CM

## 2022-03-16 DIAGNOSIS — R2689 Other abnormalities of gait and mobility: Secondary | ICD-10-CM

## 2022-03-16 DIAGNOSIS — R2681 Unsteadiness on feet: Secondary | ICD-10-CM

## 2022-03-16 DIAGNOSIS — R42 Dizziness and giddiness: Secondary | ICD-10-CM

## 2022-03-16 NOTE — Therapy (Signed)
[facility] ?[address] ?[place], [place], [address] ?Phone: [phone]   Fax:  [phone] ? ?Physical Therapy Treatment ? ?Patient Details  ?Name: Ralph Jensen ?MRN: [id number] ?Date of Birth: 04-Nov-1946 ?Referring Provider (PT): Dr. Benjamin Stain (shoulder), Dr. Ashley Royalty (gait) ? ? ?Encounter Date: 03/16/2022 ? ? PT End of Session - 03/16/22 1345   ? ? Visit Number 9   ? Number of Visits 16   ? Date for PT Re-Evaluation 04/16/22   ? Authorization Type medicare   ? Authorization - Visit Number 9   ? Progress Note Due on Visit 10   ? PT Start Time 1345   ? PT Stop Time 1430   ? PT Time Calculation (min) 45 min   ? Activity Tolerance Patient tolerated treatment well   ? Behavior During Therapy [facility] for tasks assessed/performed   ? ?  ?  ? ?  ? ? ?Past Medical History:  ?Diagnosis Date  ? Aortic valve stenosis   ? Cognitive change   ? Diabetic acidosis, type I (HCC)   ? Heart murmur   ? ? ?Past Surgical History:  ?Procedure Laterality Date  ? APPENDECTOMY    ? KIDNEY STONE SURGERY    ? SHOULDER ARTHROSCOPY WITH ROTATOR CUFF REPAIR Left   ? ? ?There were no vitals filed for this visit. ? ? Subjective Assessment - 03/16/22 1346   ? ? Subjective Pt states that dizziness is better. Shoulder is still not the best.   ? Pertinent History Parkinson's Disease, diabetes.   ? Diagnostic tests Xray ordered yesterday   ? Patient Stated Goals strengthen L shoulder, improve balance   ? Currently in Pain? No/denies   ? Pain Location Shoulder   ? Pain Orientation Left   ? Pain Onset More than a month ago   ? ?  ?  ? ?  ? ? ? ? ? [facility] PT Assessment - 03/16/22 0001   ? ?  ? Assessment  ? Medical Diagnosis gait instability and chronic L shoulder pain   ? Referring Provider (PT) Dr. Benjamin Stain (shoulder), Dr. Ashley Royalty (gait)   ? Onset Date/Surgical Date 02/09/22   ? Hand Dominance Right   ? Prior Therapy h/o PT for balance, vestibular rehab in 2021 and 2022 (he reports  "every 12-18 months I need to go through therapy")   ? ?  ?  ? ?  ? ? ? ? ? ? ? ? ? ? ? ? ? ? ? ? [facility] Adult PT Treatment/Exercise - 03/16/22 0001   ? ?  ? Lumbar Exercises: Aerobic  ? Nustep L6 x 5 min UEs/LEs   ?  ? Lumbar Exercises: Standing  ? Heel Raises 10 reps   ? Heel Raises Limitations 3 sec   ? ?  ?  ? ?  ? ? ? ? ? ? Balance Exercises - 03/16/22 0001   ? ?  ? Balance Exercises: Standing  ? Tandem Stance Eyes open;2 reps;30 secs   ? Rockerboard Anterior/posterior;Lateral;EO;30 seconds   2x30 sec static; 2x10 PA and then 2x10 L<>R  ? Gait with Head Turns Forward;2 reps   head turns and head nods; head turned right walking, head turned left walking, head turned up walking x2 reps each  ? Other Standing Exercises step over 3 pool noodles: Forward step overs x3 reps with 10 sec hold, side stepping x 3 reps x 10 sec hold; backwards step overs x3 reps x 10 sec hold   ?  Other Standing Exercises Comments Incline 2x30 sec feet together eyes closed; decline feet together eyes closed 2x30 sec   ? ?  ?  ? ?  ? ? ? ? ? ? ? PT Short Term Goals - 02/15/22 1442   ? ?  ? PT SHORT TERM GOAL #1  ? Title The patient will be indep with HEP for L shoulder ROM, strength, as well as large amplitude movements for balance/ parkinson's.   ? Time 4   ? Period Weeks   ? Target Date 03/17/22   ?  ? PT SHORT TERM GOAL #2  ? Title The patient will be further assessed for vertigo as indicated and LTG to follow, if needed.   ? Time 4   ? Period Weeks   ? Target Date 03/17/22   ?  ? PT SHORT TERM GOAL #3  ? Title The patient will reduce L shoulder pain to < or equal to 3/10.   ? Baseline 8/10   ? Time 4   ? Period Weeks   ? Target Date 03/17/22   ?  ? PT SHORT TERM GOAL #4  ? Title The patient will improve L shoulder AROM flexion to 140 degrees with pain < or equal to 2/10.   ? Baseline 118 degrees.   ? Time 4   ? Period Weeks   ? Target Date 03/17/22   ? ?  ?  ? ?  ? ? ? ? PT Long Term Goals - 02/15/22 1444   ? ?  ? PT LONG TERM GOAL #1  ?  Title The patient will be indep with HEP progression.   ? Time 8   ? Period Weeks   ? Target Date 04/16/22   ?  ? PT LONG TERM GOAL #2  ? Title The patient will improve mini BEST test from 14/28 up to 18/28 to demo improving balance.   ? Time 8   ? Period Weeks   ? Target Date 04/16/22   ?  ? PT LONG TERM GOAL #3  ? Title The patient will be able to lift 3 lb object to overhead shelf with L UE to demo improved L shoulder strength.   ? Time 8   ? Period Weeks   ? Target Date 04/16/22   ?  ? PT LONG TERM GOAL #4  ? Title The patient will verbalize understanding of post d/c PWR! classes and POP support group for ongoing mgmt of PD.   ? Time 8   ? Period Weeks   ? Target Date 04/16/22   ? ?  ?  ? ?  ? ? ? ? ? ? ? ? Plan - 03/16/22 1431   ? ? Clinical Impression Statement Pt defers canalith testing -- has not been feeling any dizziness at home. Session focused primarily on balance and dynamic gait with good tolerance. Continued LOB with posterior stepping and ambulating with head turns.   ? Personal Factors and Comorbidities Comorbidity 3+   ? Comorbidities h/o arthroscopic shoulder procedures, h/o Parkinson's, diabetes   ? Examination-Activity Limitations Lift;Reach Overhead;Locomotion Level;Squat;Stairs;Stand   ? Examination-Participation Restrictions Community Activity   ? Stability/Clinical Decision Making Stable/Uncomplicated   ? Rehab Potential Good   ? PT Frequency 2x / week   ? PT Duration 8 weeks   ? PT Treatment/Interventions ADLs/Self Care Home Management;Canalith Repostioning;Vestibular;Patient/family education;Gait training;Stair training;Functional mobility training;Therapeutic activities;Therapeutic exercise;Balance training;Neuromuscular re-education;Manual techniques;Dry needling;Taping;Moist Heat;Iontophoresis 4mg /ml Dexamethasone;Electrical Stimulation   ? PT Next Visit Plan  Assess for BPPV and treat accordingly. Progress vestibular & balancee exercises. Add PWR! large amplitude standing twist,  standing weight shift, and step.  L shoulder strengthening progression.   ? PT Home Exercise Plan Access Code [email or web address]   ? Consulted and Agree with Plan of Care Patient   ? ?  ?  ? ?  ? ? ?Patient will benefit from skilled therapeutic intervention in order to improve the following deficits and impairments:  Abnormal gait, Decreased activity tolerance, Decreased balance, Decreased mobility, Decreased strength, Postural dysfunction, Decreased range of motion, Increased fascial restricitons, Dizziness ? ?Visit Diagnosis: ?Acute pain of left shoulder ? ?Muscle weakness (generalized) ? ?Other abnormalities of gait and mobility ? ?Unsteadiness on feet ? ?Dizziness and giddiness ? ? ? ? ?Problem List ?Patient Active Problem List  ? Diagnosis Date Noted  ? Chronic left shoulder pain 02/14/2022  ? Gait instability 02/09/2022  ? Parkinsonism (HCC) 02/09/2022  ? Nonrheumatic aortic valve insufficiency 02/09/2022  ? Type 2 diabetes mellitus without complication, without long-term current use of insulin (HCC) 02/09/2022  ? Pure hypercholesterolemia 02/09/2022  ? Depression with anxiety 02/09/2022  ? ? ?Kaylin Schellenberg April Dell PontoMa L Tabby Beaston, PT, DPT ?03/16/2022, 2:33 PM ? ?[facility] ?[address] ?[place], [place], [address] ?Phone: [phone]   Fax:  [phone] ? ?Name: Maggie SchwalbeMichael Raybourn ?MRN: [id number] ?Date of Birth: 12/27/1945 ? ? ? ?

## 2022-03-23 ENCOUNTER — Encounter: Payer: Medicare Other | Admitting: Physical Therapy

## 2022-03-25 ENCOUNTER — Ambulatory Visit: Payer: Medicare Other | Admitting: Physical Therapy

## 2022-03-25 DIAGNOSIS — M6281 Muscle weakness (generalized): Secondary | ICD-10-CM

## 2022-03-25 DIAGNOSIS — M25512 Pain in left shoulder: Secondary | ICD-10-CM | POA: Diagnosis not present

## 2022-03-25 DIAGNOSIS — R2681 Unsteadiness on feet: Secondary | ICD-10-CM

## 2022-03-25 DIAGNOSIS — R42 Dizziness and giddiness: Secondary | ICD-10-CM

## 2022-03-25 DIAGNOSIS — R2689 Other abnormalities of gait and mobility: Secondary | ICD-10-CM

## 2022-03-25 NOTE — Therapy (Signed)
[place] ?[facility] ?[place] ?[place], [place], [address] ?Phone: [phone]   Fax:  [phone] ? ?Physical Therapy Treatment and 10th Visit PN ? ?Patient Details  ?Name: Ralph Jensen ?MRN: [id number] ?Date of Birth: 1946-11-08 ?Referring Provider (PT): Dr. Dianah Field (shoulder), Dr. Zigmund Daniel (gait) ? ?Progress Note ?Reporting Period 02/15/22 to 03/25/2022 ? ?See note below for Objective Data and Assessment of Progress/Goals.  ? ?  ? ?Encounter Date: 03/25/2022 ? ? PT End of Session - 03/25/22 1313   ? ? Visit Number 10   ? Number of Visits 16   ? Date for PT Re-Evaluation 04/16/22   ? Authorization Type medicare   ? Authorization - Visit Number 10   ? Progress Note Due on Visit 10   ? PT Start Time 1315   ? PT Stop Time 1400   ? PT Time Calculation (min) 45 min   ? Activity Tolerance Patient tolerated treatment well   ? Behavior During Therapy [facility] for tasks assessed/performed   ? ?  ?  ? ?  ? ? ?Past Medical History:  ?Diagnosis Date  ? Aortic valve stenosis   ? Cognitive change   ? 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 Heart murmur   ? ? ?Past Surgical History:  ?Procedure Laterality Date  ? APPENDECTOMY    ? KIDNEY STONE SURGERY    ? SHOULDER ARTHROSCOPY WITH ROTATOR CUFF REPAIR Left   ? ? ?There were no vitals filed for this visit. ? ? Subjective Assessment - 03/25/22 1315   ? ? Subjective Pt reports he has worked some on his balance exercises but not much else. Pt states he was sick earlier this week but feeling better.   ? Pertinent History Parkinson's Disease, diabetes.   ? Diagnostic tests Xray ordered yesterday   ? Patient Stated Goals strengthen L shoulder, improve balance   ? Currently in Pain? Yes   ? Pain Score 5    ? Pain Location Shoulder   ? Pain Orientation Left   ? Pain Onset More than a month ago   ? ?  ?  ? ?  ? ? ? ? ? [facility] PT Assessment - 03/25/22 0001   ? ?  ? Assessment  ? Medical Diagnosis gait instability and chronic L shoulder pain    ? Referring Provider (PT) Dr. Dianah Field (shoulder), Dr. Zigmund Daniel (gait)   ? Onset Date/Surgical Date 02/09/22   ?  ? AROM  ? Left Shoulder Flexion 140 Degrees   ? Left Shoulder ABduction 100 Degrees   limited due to pain at 100 deg but able to obtain up to 145 past painful arc  ? ?  ?  ? ?  ? ? ? ? ? ? ? ? ? ? ? ? ? ? ? ? [facility] Adult PT Treatment/Exercise - 03/25/22 0001   ? ?  ? Shoulder Exercises: Supine  ? Horizontal ABduction Strengthening;Right;Left;20 reps;Theraband   ? Theraband Level (Shoulder Horizontal ABduction) Level 2 (Red)   ? External Rotation Both;Strengthening;20 reps;Theraband   ? Theraband Level (Shoulder External Rotation) Level 2 (Red)   ? ABduction AROM;Strengthening;Left;20 reps   ? Other Supine Exercises "W" 2x10 red tband   ?  ? Shoulder Exercises: Sidelying  ? ABduction AAROM;Left;10 reps   ? ABduction Limitations Manual therapist assist   ?  ? Shoulder Exercises: Stretch  ? Other Shoulder Stretches "snow angel" stretch x2 min   ?  ? Manual Therapy  ? Manual therapy  comments skilled assessment and palpation for TPDN and STM   ? Joint Mobilization scapular mobs   ? Soft tissue mobilization STM & TPR L pecs, UTs   ? ?  ?  ? ?  ? ? ? Trigger Point Dry Needling - 03/25/22 0001   ? ? Consent Given? Yes   ? Education Handout Provided Yes   ? Muscles Treated Upper Quadrant Pectoralis major;Pectoralis minor   ? Pectoralis Major Response Twitch response elicited;Palpable increased muscle length   ? Pectoralis Minor Response Palpable increased muscle length   ? ?  ?  ? ?  ? ? ? ? ? ? ? ? ? ? PT Short Term Goals - 03/25/22 1359   ? ?  ? PT SHORT TERM GOAL #1  ? Title The patient will be indep with HEP for L shoulder ROM, strength, as well as large amplitude movements for balance/ parkinson's.   ? Baseline Has not been consistent 03/25/22   ? Time 4   ? Period Weeks   ? Status On-going   ? Target Date 03/17/22   ?  ? PT SHORT TERM GOAL #2  ? Title The patient will be further assessed for vertigo as  indicated and LTG to follow, if needed.   ? Time 4   ? Period Weeks   ? Status Achieved   ? Target Date 03/17/22   ?  ? PT SHORT TERM GOAL #3  ? Title The patient will reduce L shoulder pain to < or equal to 3/10.   ? Baseline 5/10 on 03/25/22   ? Time 4   ? Period Weeks   ? Status Partially Met   ? Target Date 03/17/22   ?  ? PT SHORT TERM GOAL #4  ? Title The patient will improve L shoulder AROM flexion to 140 degrees with pain < or equal to 2/10.   ? Baseline Full range pain free in supine on 03/25/22   ? Time 4   ? Period Weeks   ? Status Achieved   ? Target Date 03/17/22   ? ?  ?  ? ?  ? ? ? ? PT Long Term Goals - 02/15/22 1444   ? ?  ? PT LONG TERM GOAL #1  ? Title The patient will be indep with HEP progression.   ? Time 8   ? Period Weeks   ? Target Date 04/16/22   ?  ? PT LONG TERM GOAL #2  ? Title The patient will improve mini BEST test from 14/28 up to 18/28 to demo improving balance.   ? Time 8   ? Period Weeks   ? Target Date 04/16/22   ?  ? PT LONG TERM GOAL #3  ? Title The patient will be able to lift 3 lb object to overhead shelf with L UE to demo improved L shoulder strength.   ? Time 8   ? Period Weeks   ? Target Date 04/16/22   ?  ? PT LONG TERM GOAL #4  ? Title The patient will verbalize understanding of post d/c PWR! classes and POP support group for ongoing mgmt of PD.   ? Time 8   ? Period Weeks   ? Target Date 04/16/22   ? ?  ?  ? ?  ? ? ? ? ? ? ? ? Plan - 03/25/22 1358   ? ? Clinical Impression Statement Performed trial of TPDN to work on pt's pec tightness. Continued to  work on posterior shoulder girdle and RTC strengthening for pt's shoulder. Able to tolerate addition of red tband without increase in pain. In terms of goals, pt is slowly meeting his STG and LTGs. Pain with flexion has improved;however, abduction remains the most painful.   ? Personal Factors and Comorbidities Comorbidity 3+   ? Comorbidities h/o arthroscopic shoulder procedures, h/o Parkinson's, diabetes   ?  Examination-Activity Limitations Lift;Reach Overhead;Locomotion Level;Squat;Stairs;Stand   ? Examination-Participation Restrictions Community Activity   ? Stability/Clinical Decision Making Stable/Uncomplicated   ? Rehab Potential Good   ? PT Frequency 2x / week   ? PT Duration 8 weeks   ? PT Treatment/Interventions ADLs/Self Care Home Management;Canalith Repostioning;Vestibular;Patient/family education;Gait training;Stair training;Functional mobility training;Therapeutic activities;Therapeutic exercise;Balance training;Neuromuscular re-education;Manual techniques;Dry needling;Taping;Moist Heat;Iontophoresis 38m/ml Dexamethasone;Electrical Stimulation   ? PT Next Visit Plan Assess for BPPV and treat accordingly. Progress vestibular & balancee exercises. Add PWR! large amplitude standing twist, standing weight shift, and step.  L shoulder strengthening progression.   ? PT Home Exercise Plan Access Code [email or web address]   ? Consulted and Agree with Plan of Care Patient   ? ?  ?  ? ?  ? ? ?Patient will benefit from skilled therapeutic intervention in order to improve the following deficits and impairments:  Abnormal gait, Decreased activity tolerance, Decreased balance, Decreased mobility, Decreased strength, Postural dysfunction, Decreased range of motion, Increased fascial restricitons, Dizziness ? ?Visit Diagnosis: ?Acute pain of left shoulder ? ?Muscle weakness (generalized) ? ?Other abnormalities of gait and mobility ? ?Unsteadiness on feet ? ?Dizziness and giddiness ? ? ? ? ?Problem List ?Patient Active Problem List  ? Diagnosis Date Noted  ? Chronic left shoulder pain 02/14/2022  ? Gait instability 02/09/2022  ? Parkinsonism ([place] 02/09/2022  ? Nonrheumatic aortic valve insufficiency 02/09/2022  ? Type 2 diabetes mellitus without complication, without long-term current use of insulin ([place] 02/09/2022  ? Pure hypercholesterolemia 02/09/2022  ? Depression with anxiety 02/09/2022  ? ? ?Rheta Hemmelgarn April MGordy Levan PT,  DPT ?03/25/2022, 2:49 PM ? ?[place] ?[facility] ?[address]?[place] [address]?Phone: [phone]  Fax:  [phone]? ?Name: MJansel Jensen?MRN: [id number]

## 2022-03-28 ENCOUNTER — Ambulatory Visit: Payer: Medicare Other | Admitting: Sports Medicine

## 2022-03-29 ENCOUNTER — Ambulatory Visit: Payer: Medicare Other | Attending: Sports Medicine | Admitting: Physical Therapy

## 2022-03-29 DIAGNOSIS — R2689 Other abnormalities of gait and mobility: Secondary | ICD-10-CM | POA: Insufficient documentation

## 2022-03-29 DIAGNOSIS — M25512 Pain in left shoulder: Secondary | ICD-10-CM | POA: Diagnosis present

## 2022-03-29 DIAGNOSIS — M6281 Muscle weakness (generalized): Secondary | ICD-10-CM | POA: Insufficient documentation

## 2022-03-29 DIAGNOSIS — R2681 Unsteadiness on feet: Secondary | ICD-10-CM | POA: Insufficient documentation

## 2022-03-29 DIAGNOSIS — R42 Dizziness and giddiness: Secondary | ICD-10-CM | POA: Insufficient documentation

## 2022-03-29 NOTE — Therapy (Signed)
[place] ?[facility] ?[place] ?[place], [place], [address] ?Phone: [phone]   Fax:  [phone] ? ?Physical Therapy Treatment ? ?Patient Details  ?Name: Ralph Jensen ?MRN: [id number] ?Date of Birth: 1946-11-19 ?Referring Provider (PT): Dr. Dianah Field (shoulder), Dr. Zigmund Daniel (gait) ? ? ?Encounter Date: 03/29/2022 ? ? PT End of Session - 03/29/22 1144   ? ? Visit Number 11   ? Number of Visits 16   ? Date for PT Re-Evaluation 04/16/22   ? Authorization Type medicare   ? Authorization - Visit Number 11   ? Progress Note Due on Visit 10   ? PT Start Time 1145   ? PT Stop Time 1230   ? PT Time Calculation (min) 45 min   ? Activity Tolerance Patient tolerated treatment well   ? Behavior During Therapy [facility] for tasks assessed/performed   ? ?  ?  ? ?  ? ? ?Past Medical History:  ?Diagnosis Date  ? Aortic valve stenosis   ? Cognitive change   ? 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 Heart murmur   ? ? ?Past Surgical History:  ?Procedure Laterality Date  ? APPENDECTOMY    ? KIDNEY STONE SURGERY    ? SHOULDER ARTHROSCOPY WITH ROTATOR CUFF REPAIR Left   ? ? ?There were no vitals filed for this visit. ? ? Subjective Assessment - 03/29/22 1148   ? ? Subjective Pt reports he will see Dr. Darene Lamer tomorrow to f/u on his shoulder. Pt reports some soreness from the needling but did improve the next day. Pt states some increased shoulder pain thinks it was due to his sleeping position.   ? Pertinent History Parkinson's Disease, diabetes.   ? Diagnostic tests Xray ordered yesterday   ? Patient Stated Goals strengthen L shoulder, improve balance   ? Currently in Pain? Yes   ? Pain Score 6    ? Pain Location Shoulder   ? Pain Orientation Left   ? Pain Onset More than a month ago   ? ?  ?  ? ?  ? ? ? ? ? [facility] PT Assessment - 03/29/22 0001   ? ?  ? Assessment  ? Medical Diagnosis gait instability and chronic L shoulder pain   ? Referring Provider (PT) Dr. Dianah Field (shoulder), Dr.  Zigmund Daniel (gait)   ? Onset Date/Surgical Date 02/09/22   ? Hand Dominance Right   ?  ? AROM  ? Left Shoulder Flexion 143 Degrees   pain free  ? Left Shoulder ABduction 152 Degrees   3/10 pain noted at ~100 deg  ? Left Shoulder External Rotation 45 Degrees   ? ?  ?  ? ?  ? ? ? ? ? ? ? ? ? ? ? ? ? ? ? ? [place] Adult PT Treatment/Exercise - 03/29/22 0001   ? ?  ? Lumbar Exercises: Aerobic  ? Recumbent Bike L2 x 5 min   ?  ? Shoulder Exercises: Seated  ? Horizontal ABduction Strengthening;Both;20 reps;Theraband   ? Theraband Level (Shoulder Horizontal ABduction) Level 2 (Red)   ? External Rotation Strengthening;Both;20 reps;Theraband   ? Theraband Level (Shoulder External Rotation) Level 3 (Green)   ? Other Seated Exercises "W" 2x10   ?  ? Shoulder Exercises: Sidelying  ? Flexion Strengthening;Left   ? Flexion Limitations in scaption   ? ABduction Left;10 reps;AROM   at end range  ?  ? Shoulder Exercises: Stretch  ? Other Shoulder Stretches Doorway stretch low, mid  and high x30 sec each   ? ?  ?  ? ?  ? ? Vestibular Treatment/Exercise - 03/29/22 0001   ? ?  ? X1 Viewing Horizontal  ? Foot Position standing feet apart on foam   ? Comments 2x30 sec   ?  ? X1 Viewing Vertical  ? Foot Position standing feet apart on foam   ? Comments 2x30 sec   ?  ? Eye/Head Exercise Horizontal  ? Foot Position standing feet apart on foam   ? Comments smooth pursuit & then saccades 2x30 sec   ?  ? Eye/Head Exercise Vertical  ? Foot Position standing feet apart on foam   ? Comments smooth pursuit & then saccades 2x30 sec   ? ?  ?  ? ?  ? ? ? ? ? Balance Exercises - 03/29/22 0001   ? ?  ? Balance Exercises: Standing  ? Tandem Stance Eyes open;2 reps;30 secs;Foam/compliant surface   ? Retro Gait 1 rep   around gym  ? Marching Dynamic;Forwards   around gym  ? ?  ?  ? ?  ? ? ? ? ? ? ? PT Short Term Goals - 03/25/22 1359   ? ?  ? PT SHORT TERM GOAL #1  ? Title The patient will be indep with HEP for L shoulder ROM, strength, as well as large  amplitude movements for balance/ parkinson's.   ? Baseline Has not been consistent 03/25/22   ? Time 4   ? Period Weeks   ? Status On-going   ? Target Date 03/17/22   ?  ? PT SHORT TERM GOAL #2  ? Title The patient will be further assessed for vertigo as indicated and LTG to follow, if needed.   ? Time 4   ? Period Weeks   ? Status Achieved   ? Target Date 03/17/22   ?  ? PT SHORT TERM GOAL #3  ? Title The patient will reduce L shoulder pain to < or equal to 3/10.   ? Baseline 5/10 on 03/25/22   ? Time 4   ? Period Weeks   ? Status Partially Met   ? Target Date 03/17/22   ?  ? PT SHORT TERM GOAL #4  ? Title The patient will improve L shoulder AROM flexion to 140 degrees with pain < or equal to 2/10.   ? Baseline Full range pain free in supine on 03/25/22   ? Time 4   ? Period Weeks   ? Status Achieved   ? Target Date 03/17/22   ? ?  ?  ? ?  ? ? ? ? PT Long Term Goals - 02/15/22 1444   ? ?  ? PT LONG TERM GOAL #1  ? Title The patient will be indep with HEP progression.   ? Time 8   ? Period Weeks   ? Target Date 04/16/22   ?  ? PT LONG TERM GOAL #2  ? Title The patient will improve mini BEST test from 14/28 up to 18/28 to demo improving balance.   ? Time 8   ? Period Weeks   ? Target Date 04/16/22   ?  ? PT LONG TERM GOAL #3  ? Title The patient will be able to lift 3 lb object to overhead shelf with L UE to demo improved L shoulder strength.   ? Time 8   ? Period Weeks   ? Target Date 04/16/22   ?  ?  PT LONG TERM GOAL #4  ? Title The patient will verbalize understanding of post d/c PWR! classes and POP support group for ongoing mgmt of PD.   ? Time 8   ? Period Weeks   ? Target Date 04/16/22   ? ?  ?  ? ?  ? ? ? ? ? ? ? ? Plan - 03/29/22 1207   ? ? Clinical Impression Statement Treatment focused more on pt's balance and improving his vestibular system.   ? Personal Factors and Comorbidities Comorbidity 3+   ? Comorbidities h/o arthroscopic shoulder procedures, h/o Parkinson's, diabetes   ? Examination-Activity  Limitations Lift;Reach Overhead;Locomotion Level;Squat;Stairs;Stand   ? Examination-Participation Restrictions Community Activity   ? Stability/Clinical Decision Making Stable/Uncomplicated   ? Rehab Potential Good   ? PT Frequency 2x / week   ? PT Duration 8 weeks   ? PT Treatment/Interventions ADLs/Self Care Home Management;Canalith Repostioning;Vestibular;Patient/family education;Gait training;Stair training;Functional mobility training;Therapeutic activities;Therapeutic exercise;Balance training;Neuromuscular re-education;Manual techniques;Dry needling;Taping;Moist Heat;Iontophoresis 21m/ml Dexamethasone;Electrical Stimulation   ? PT Next Visit Plan Assess for BPPV and treat accordingly. Progress vestibular & balancee exercises. Add PWR! large amplitude standing twist, standing weight shift, and step.  L shoulder strengthening progression.   ? PT Home Exercise Plan Access Code [email or web address]   ? Consulted and Agree with Plan of Care Patient   ? ?  ?  ? ?  ? ? ?Patient will benefit from skilled therapeutic intervention in order to improve the following deficits and impairments:  Abnormal gait, Decreased activity tolerance, Decreased balance, Decreased mobility, Decreased strength, Postural dysfunction, Decreased range of motion, Increased fascial restricitons, Dizziness ? ?Visit Diagnosis: ?Acute pain of left shoulder ? ?Muscle weakness (generalized) ? ?Other abnormalities of gait and mobility ? ?Unsteadiness on feet ? ?Dizziness and giddiness ? ? ? ? ?Problem List ?Patient Active Problem List  ? Diagnosis Date Noted  ? Chronic left shoulder pain 02/14/2022  ? Gait instability 02/09/2022  ? Parkinsonism ([place] 02/09/2022  ? Nonrheumatic aortic valve insufficiency 02/09/2022  ? Type 2 diabetes mellitus without complication, without long-term current use of insulin ([place] 02/09/2022  ? Pure hypercholesterolemia 02/09/2022  ? Depression with anxiety 02/09/2022  ? ? ?Hillery Bhalla April MGordy Levan PT, DPT ?03/29/2022, 12:48  PM ? ?[place] ?[facility] ?[address] [address]?Phone: [phone]  Fax:  [phone]? ?Name: MMakel Jensen?MRN: [id number]?Date of Birth: [birth year]

## 2022-03-30 ENCOUNTER — Ambulatory Visit (INDEPENDENT_AMBULATORY_CARE_PROVIDER_SITE_OTHER): Payer: Medicare Other | Admitting: Sports Medicine

## 2022-03-30 DIAGNOSIS — M25512 Pain in left shoulder: Secondary | ICD-10-CM | POA: Diagnosis not present

## 2022-03-30 DIAGNOSIS — G8929 Other chronic pain: Secondary | ICD-10-CM

## 2022-03-30 MED ORDER — TRAMADOL HCL 50 MG PO TABS: 50.0000 mg | ORAL_TABLET | Freq: Three times a day (TID) | ORAL | 0 refills | Status: DC | PRN

## 2022-03-30 NOTE — Assessment & Plan Note (Signed)
Ralph Jensen returns, he is a very pleasant 76 year old male, he has a history of a parkinsonian type syndrome, recently moved from [place], he has chronic left shoulder pain localized over the deltoid, he did have an arthroscopic debridement back in the 80s. ?He has also been working with an Doctor, general practice at the [place] in [place]. ?He was told that he needed a shoulder arthroplasty. ?On exam he has mostly impingement signs. ?X-rays do not show much arthritis but he does have signs of an old rotator cuff repair and a deformity superior glenoid that may be related to chronic impingement. ?We switched him to some naproxen, and he has done a good amount of physical therapy. ?Unfortunately continues to have severe pain, adding tramadol for pain relief, MRI, and I would like a consultation with Dr. Griffin Basil. ?I did offer a subacromial injection, we can revisit this after the MRI. ?

## 2022-03-30 NOTE — Progress Notes (Signed)
? ? ?  Procedures performed today:   ? ?None. ? ?Independent interpretation of notes and tests performed by another provider:  ? ?None. ? ?Brief History, Exam, Impression, and Recommendations:   ? ?Chronic left shoulder pain ?Ralph Jensen returns, he is a very pleasant 76 year old male, he has a history of a parkinsonian type syndrome, recently moved from [place], he has chronic left shoulder pain localized over the deltoid, he did have an arthroscopic debridement back in the 80s. ?He has also been working with an Investment banker, operational at the [employer] in [place]. ?He was told that he needed a shoulder arthroplasty. ?On exam he has mostly impingement signs. ?X-rays do not show much arthritis but he does have signs of an old rotator cuff repair and a deformity superior glenoid that may be related to chronic impingement. ?We switched him to some naproxen, and he has done a good amount of physical therapy. ?Unfortunately continues to have severe pain, adding tramadol for pain relief, MRI, and I would like a consultation with Dr. Everardo Pacific. ?I did offer a subacromial injection, we can revisit this after the MRI. ? ?Chronic process with exacerbation and pharmacologic intervention ? ?___________________________________________ ?Ralph Jensen. Benjamin Stain, M.D., ABFM., CAQSM. ?Primary Care and Sports Medicine ?[facility] ? ?Adjunct Instructor of Family Medicine  ?[employer] ?

## 2022-04-01 ENCOUNTER — Ambulatory Visit: Payer: Medicare Other | Admitting: Physical Therapy

## 2022-04-01 DIAGNOSIS — M25512 Pain in left shoulder: Secondary | ICD-10-CM | POA: Diagnosis not present

## 2022-04-01 DIAGNOSIS — R2689 Other abnormalities of gait and mobility: Secondary | ICD-10-CM

## 2022-04-01 DIAGNOSIS — R42 Dizziness and giddiness: Secondary | ICD-10-CM

## 2022-04-01 DIAGNOSIS — M6281 Muscle weakness (generalized): Secondary | ICD-10-CM

## 2022-04-01 DIAGNOSIS — R2681 Unsteadiness on feet: Secondary | ICD-10-CM

## 2022-04-01 NOTE — Therapy (Signed)
[place] [facility] ?[place] ?[place], [place], [address] ?Phone: [phone]   Fax:  [phone] ? ?Physical Therapy Treatment ? ?Patient Details  ?Name: Ralph Jensen ?MRN: [id number] ?Date of Birth: [birth year] ?Referring Provider (PT): Dr. Dianah Field (shoulder), Dr. Zigmund Daniel (gait) ? ? ?Encounter Date: 04/01/2022 ? ? PT End of Session - 04/01/22 1020   ? ? Visit Number 12   ? Number of Visits 16   ? Date for PT Re-Evaluation 04/16/22   ? Authorization Type medicare   ? Authorization - Visit Number 12   ? Progress Note Due on Visit 10   ? PT Start Time 1021   ? PT Stop Time 1100   ? PT Time Calculation (min) 39 min   ? Activity Tolerance Patient tolerated treatment well   ? Behavior During Therapy [facility] for tasks assessed/performed   ? ?  ?  ? ?  ? ? ?Past Medical History:  ?Diagnosis Date  ? Aortic valve stenosis   ? Cognitive change   ? 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 Heart murmur   ? ? ?Past Surgical History:  ?Procedure Laterality Date  ? APPENDECTOMY    ? KIDNEY STONE SURGERY    ? SHOULDER ARTHROSCOPY WITH ROTATOR CUFF REPAIR Left   ? ? ?There were no vitals filed for this visit. ? ? Subjective Assessment - 04/01/22 1024   ? ? Subjective Pt saw Dr. Darene Lamer and will get MRI and seek orthopedic surgeon consult. He states that he has been able to walk a little more.   ? Pertinent History Parkinson's Disease, diabetes.   ? Diagnostic tests Xray ordered yesterday   ? Patient Stated Goals strengthen L shoulder, improve balance   ? Currently in Pain? Yes   ? Pain Location Shoulder   ? Pain Orientation Left   ? Pain Onset More than a month ago   ? ?  ?  ? ?  ? ? ? ? ? [facility] PT Assessment - 04/01/22 0001   ? ?  ? Assessment  ? Medical Diagnosis gait instability and chronic L shoulder pain   ? Referring Provider (PT) Dr. Dianah Field (shoulder), Dr. Zigmund Daniel (gait)   ? Onset Date/Surgical Date 02/09/22   ? Hand Dominance Right   ? ?  ?  ? ?   ? ? ? ? ? ? ? ? ? ? ? ? ? ? ? ? [place] Adult PT Treatment/Exercise - 04/01/22 0001   ? ?  ? Lumbar Exercises: Aerobic  ? Nustep L5 x 5 min UEs/LEs   ?  ? Lumbar Exercises: Standing  ? Heel Raises 20 reps   ?  ? Lumbar Exercises: Seated  ? Sit to Stand 20 reps   ? Other Seated Lumbar Exercises staggered sit to stand x10 each foot   ?  ? Shoulder Exercises: Standing  ? Other Standing Exercises arm abducted shoulder ER reactive iso x10, IR reactive iso x10   ?  ? Shoulder Exercises: Stretch  ? Other Shoulder Stretches Doorway stretch low, mid and high x30 sec each   ? ?  ?  ? ?  ? ? ? ? ? ? Balance Exercises - 04/01/22 0001   ? ?  ? Balance Exercises: Standing  ? Standing Eyes Closed 30 secs;Narrow base of support (BOS);Foam/compliant surface;4 reps   ? SLS Eyes open;3 reps;10 secs   ? Heel Raises 5 reps;Both   3 sec  ? Other Standing Exercises Eyes closed on  incline & then decline 2x30 sec   ? ?  ?  ? ?  ? ? ? ? ? ? ? PT Short Term Goals - 03/25/22 1359   ? ?  ? PT SHORT TERM GOAL #1  ? Title The patient will be indep with HEP for L shoulder ROM, strength, as well as large amplitude movements for balance/ parkinson's.   ? Baseline Has not been consistent 03/25/22   ? Time 4   ? Period Weeks   ? Status On-going   ? Target Date 03/17/22   ?  ? PT SHORT TERM GOAL #2  ? Title The patient will be further assessed for vertigo as indicated and LTG to follow, if needed.   ? Time 4   ? Period Weeks   ? Status Achieved   ? Target Date 03/17/22   ?  ? PT SHORT TERM GOAL #3  ? Title The patient will reduce L shoulder pain to < or equal to 3/10.   ? Baseline 5/10 on 03/25/22   ? Time 4   ? Period Weeks   ? Status Partially Met   ? Target Date 03/17/22   ?  ? PT SHORT TERM GOAL #4  ? Title The patient will improve L shoulder AROM flexion to 140 degrees with pain < or equal to 2/10.   ? Baseline Full range pain free in supine on 03/25/22   ? Time 4   ? Period Weeks   ? Status Achieved   ? Target Date 03/17/22   ? ?  ?  ? ?  ? ? ? ? PT  Long Term Goals - 02/15/22 1444   ? ?  ? PT LONG TERM GOAL #1  ? Title The patient will be indep with HEP progression.   ? Time 8   ? Period Weeks   ? Target Date 04/16/22   ?  ? PT LONG TERM GOAL #2  ? Title The patient will improve mini BEST test from 14/28 up to 18/28 to demo improving balance.   ? Time 8   ? Period Weeks   ? Target Date 04/16/22   ?  ? PT LONG TERM GOAL #3  ? Title The patient will be able to lift 3 lb object to overhead shelf with L UE to demo improved L shoulder strength.   ? Time 8   ? Period Weeks   ? Target Date 04/16/22   ?  ? PT LONG TERM GOAL #4  ? Title The patient will verbalize understanding of post d/c PWR! classes and POP support group for ongoing mgmt of PD.   ? Time 8   ? Period Weeks   ? Target Date 04/16/22   ? ?  ?  ? ?  ? ? ? ? ? ? ? ? Plan - 04/01/22 1100   ? ? Clinical Impression Statement Continued to focus on balance. Challenged with eyes closed on foam and on incline. Improving SLS. Worked on shoulder ER in abduction.   ? Personal Factors and Comorbidities Comorbidity 3+   ? Comorbidities h/o arthroscopic shoulder procedures, h/o Parkinson's, diabetes   ? Examination-Activity Limitations Lift;Reach Overhead;Locomotion Level;Squat;Stairs;Stand   ? Examination-Participation Restrictions Community Activity   ? Stability/Clinical Decision Making Stable/Uncomplicated   ? Rehab Potential Good   ? PT Frequency 2x / week   ? PT Duration 8 weeks   ? PT Treatment/Interventions ADLs/Self Care Home Management;Canalith Repostioning;Vestibular;Patient/family education;Gait training;Stair training;Functional mobility training;Therapeutic activities;Therapeutic exercise;Balance training;Neuromuscular  re-education;Manual techniques;Dry needling;Taping;Moist Heat;Iontophoresis 29m/ml Dexamethasone;Electrical Stimulation   ? PT Next Visit Plan Assess for BPPV and treat accordingly. Progress vestibular & balancee exercises. Add PWR! large amplitude standing twist, standing weight shift, and  step.  L shoulder strengthening progression.   ? PT Home Exercise Plan Access Code [email or web address]   ? Consulted and Agree with Plan of Care Patient   ? ?  ?  ? ?  ? ? ?Patient will benefit from skilled therapeutic intervention in order to improve the following deficits and impairments:  Abnormal gait, Decreased activity tolerance, Decreased balance, Decreased mobility, Decreased strength, Postural dysfunction, Decreased range of motion, Increased fascial restricitons, Dizziness ? ?Visit Diagnosis: ?Acute pain of left shoulder ? ?Muscle weakness (generalized) ? ?Other abnormalities of gait and mobility ? ?Unsteadiness on feet ? ?Dizziness and giddiness ? ? ? ? ?Problem List ?Patient Active Problem List  ? Diagnosis Date Noted  ? Chronic left shoulder pain 02/14/2022  ? Gait instability 02/09/2022  ? Parkinsonism ([place] 02/09/2022  ? Nonrheumatic aortic valve insufficiency 02/09/2022  ? Type 2 diabetes mellitus without complication, without long-term current use of insulin ([place] 02/09/2022  ? Pure hypercholesterolemia 02/09/2022  ? Depression with anxiety 02/09/2022  ? ? ?Salik Grewell April MGordy Levan PT, DPT ?04/01/2022, 11:02 AM ? ?[place] [facility] ?[address]?[place] [address]?Phone: [phone]  Fax:  [phone]? ?Name: Ralph Jensen?MRN: [id number]?Date of Birth: [birth year]? ? ? ?

## 2022-04-04 ENCOUNTER — Ambulatory Visit (INDEPENDENT_AMBULATORY_CARE_PROVIDER_SITE_OTHER): Payer: Medicare Other

## 2022-04-04 DIAGNOSIS — G8929 Other chronic pain: Secondary | ICD-10-CM

## 2022-04-04 DIAGNOSIS — M25512 Pain in left shoulder: Secondary | ICD-10-CM | POA: Diagnosis not present

## 2022-04-04 IMAGING — MR MR SHOULDER*L* W/O CM
7 series · 40 of 40 positions shown · non-contrast
Comparison: Left shoulder radiographs [DATE]

CLINICAL DATA: Shoulder pain. Rotator cuff disorder suspected.
Impingement syndrome. History of rotator cuff repair in [REDACTED].

EXAM:
MRI OF THE LEFT SHOULDER WITHOUT CONTRAST
TECHNIQUE: Multiplanar, multisequence MR imaging of the shoulder was performed.
No intravenous contrast was administered.

[Series 3: T2 fat-sat · axial · 4.0mm · 0.55mm/px · z∈[-26,+75]mm · 8 of 22 slices shown (1 of 4)]
[im 1/22]
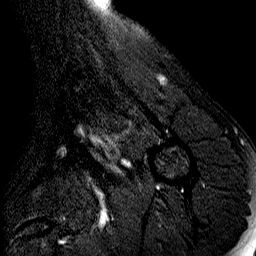
[im 4/22]
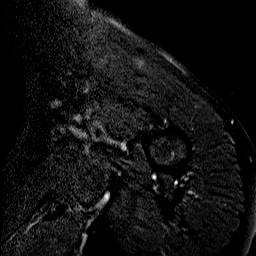
[im 7/22]
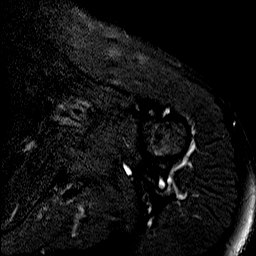
[im 10/22]
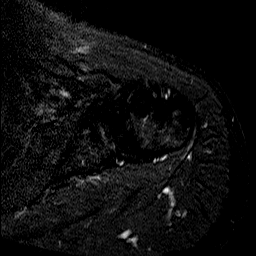
[im 13/22]
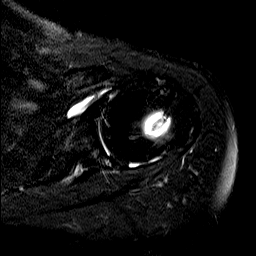
[im 16/22]
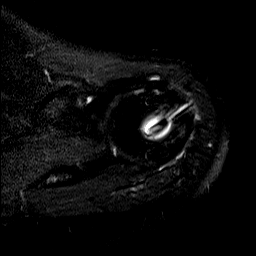
[im 19/22]
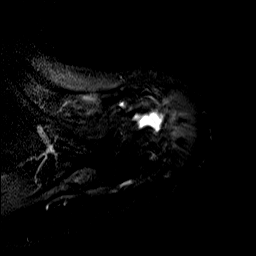
[im 22/22]
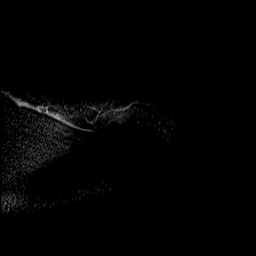

[Series 4: T2 fat-sat · oblique · 4.0mm · 0.55mm/px · 5 of 18 slices shown (2 of 4)]
[im 1/18]
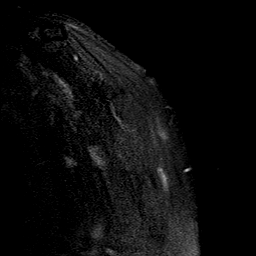
[im 5/18]
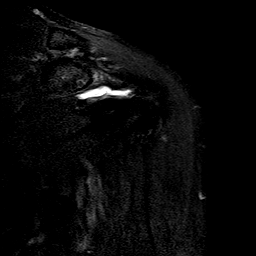
[im 9/18]
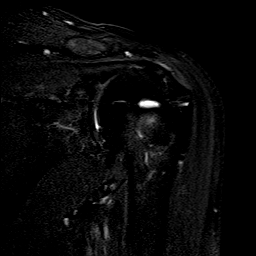
[im 13/18]
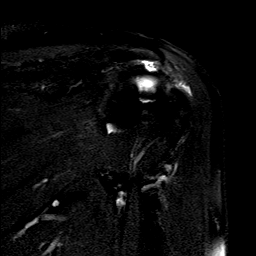
[im 18/18]
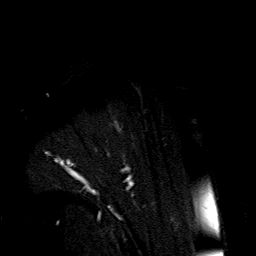

[Series 5: PD · oblique · 4.0mm · 0.55mm/px · 5 of 18 slices shown (1 of 2)]
[im 1/18]
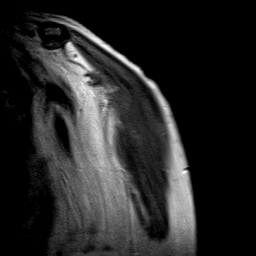
[im 5/18]
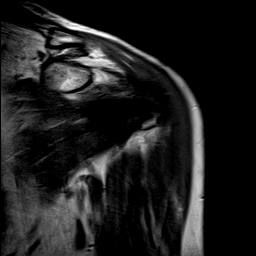
[im 9/18]
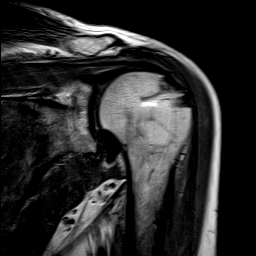
[im 13/18]
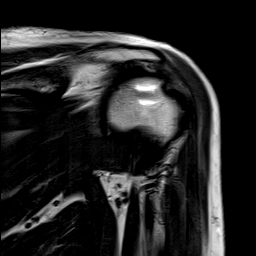
[im 18/18]
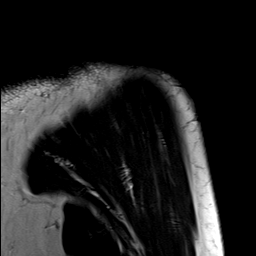

[Series 6: T2 fat-sat · sagittal · 4.0mm · 0.55mm/px · 6 of 20 slices shown (3 of 4)]
[im 1/20]
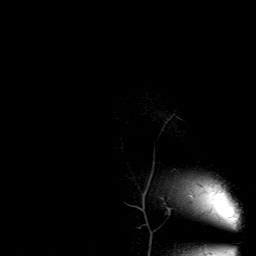
[im 4/20]
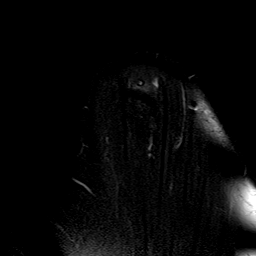
[im 8/20]
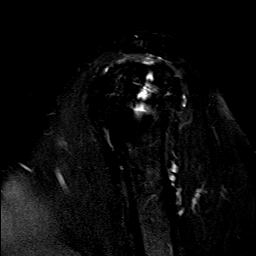
[im 12/20]
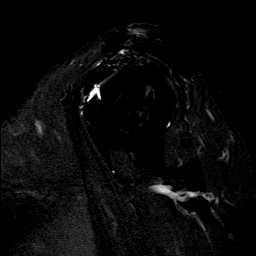
[im 16/20]
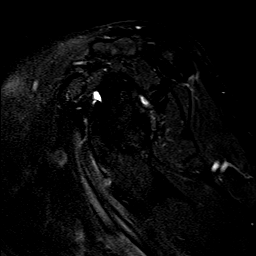
[im 20/20]
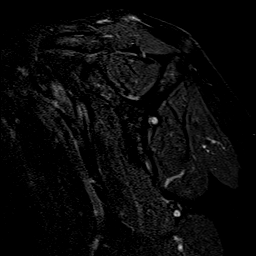

[Series 7: T1 · sagittal · 4.0mm · 0.55mm/px · 6 of 20 slices shown]
[im 1/20]
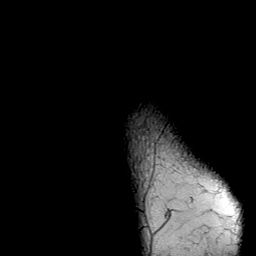
[im 4/20]
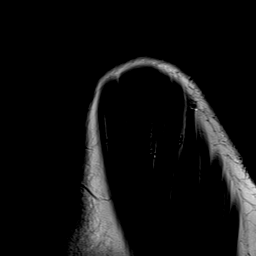
[im 8/20]
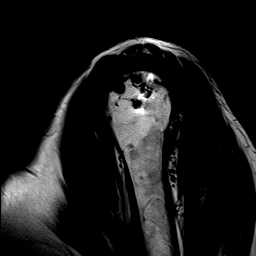
[im 12/20]
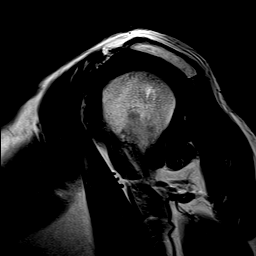
[im 16/20]
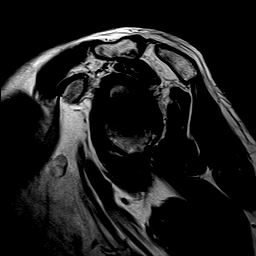
[im 20/20]
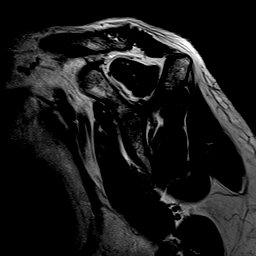

[Series 8: T2 fat-sat · oblique · 4.0mm · 0.55mm/px · 5 of 18 slices shown (4 of 4)]
[im 1/18]
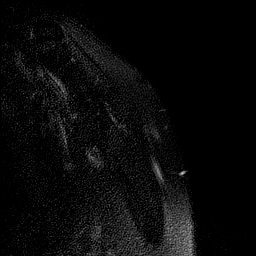
[im 5/18]
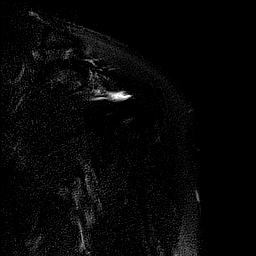
[im 9/18]
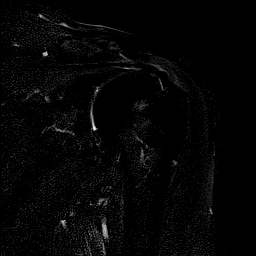
[im 13/18]
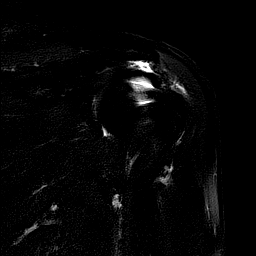
[im 18/18]
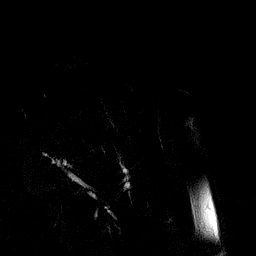

[Series 9: PD · oblique · 4.0mm · 0.55mm/px · 5 of 18 slices shown (2 of 2)]
[im 1/18]
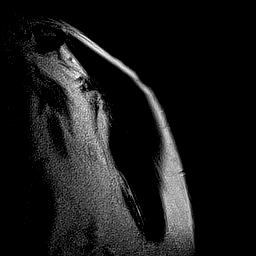
[im 5/18]
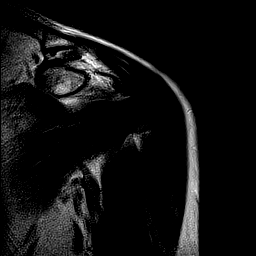
[im 9/18]
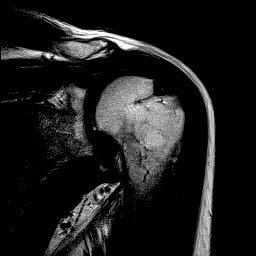
[im 13/18]
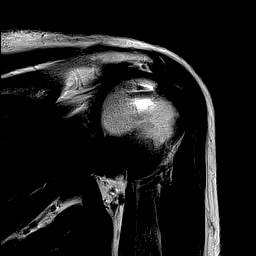
[im 18/18]
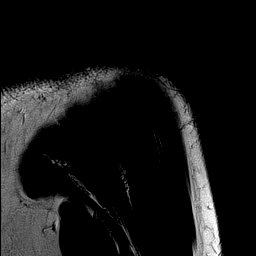

[40 of 40 positions shown; findings below may reference images not displayed]

FINDINGS: Rotator cuff: There is metallic susceptibility artifact from at
least 4 screw anchors within the greater tuberosity. This limits
visualization of the superior rotator cuff, especially the posterior
supraspinatus and anterior infraspinatus. Within this limitation,
there appears to be true fluid bright signal indicating a
full-thickness tear of the critical zone of the posterior
supraspinatus tendon measuring up to 12 mm in AP dimension (sagittal
series 6, image 9) and 11 mm in transverse dimension (coronal series
4, image 7). There is also thinning of the more distal tendon fibers
in this same posterior supraspinatus region. The subscapularis and
teres minor are intact.

Muscles:  Mild supraspinatus muscle atrophy.

Biceps long head: The intra-articular long head of the biceps tendon
is intact.

Acromioclavicular Joint: There are mild degenerative changes of the
acromioclavicular joint including joint space narrowing, subchondral
marrow edema, and peripheral osteophytosis. Postsurgical changes of
distal clavicle excision and acromioplasty. Type 1 to type 2
acromion. No subacromial/subdeltoid bursitis.

Glenohumeral Joint: Full-thickness cartilage loss within the
posterior superior glenoid quadrant with moderate subchondral cystic
change. Moderate thinning of the superomedial humeral head
cartilage.

Labrum: Posterior mid and inferior glenoid chondrolabral junction
tear with small posteroinferior paralabral cyst measuring up to 3 mm
in transverse dimension, 7 mm in AP dimension, and 22 mm in
craniocaudal dimension (axial image 16 and sagittal series 6, image
14).

Bones:  No acute fracture.

Other: None.
IMPRESSION: :
IMPRESSION: 1. Remote superior rotator cuff repair. There is a full-thickness
tear within the posterior supraspinatus tendon critical zone
measuring up to 12 mm in AP dimension. Mild supraspinatus muscle
atrophy.
2. Status post distal clavicle excision and acromioplasty.
3. Full-thickness cartilage loss within the posterosuperior glenoid
quadrant with a posterior chondrolabral junction degenerative tear
and posteroinferior paralabral cyst.

## 2022-04-05 ENCOUNTER — Ambulatory Visit: Payer: Medicare Other | Admitting: Physical Therapy

## 2022-04-05 ENCOUNTER — Telehealth: Payer: Self-pay | Admitting: Family Medicine

## 2022-04-05 ENCOUNTER — Other Ambulatory Visit: Payer: Self-pay | Admitting: Orthopaedic Surgery

## 2022-04-05 DIAGNOSIS — Z01818 Encounter for other preprocedural examination: Secondary | ICD-10-CM

## 2022-04-05 NOTE — Telephone Encounter (Signed)
Patients wife dropped off pre-surgical forms for PCP to complete. Patients wife requested we fax forms after completion. Patients wife informed of possible fee and 3-5 day turn around - paperwork placed in providers box - lmr ?

## 2022-04-06 ENCOUNTER — Ambulatory Visit: Payer: Medicare Other | Admitting: Physical Therapy

## 2022-04-06 DIAGNOSIS — M6281 Muscle weakness (generalized): Secondary | ICD-10-CM

## 2022-04-06 DIAGNOSIS — M25512 Pain in left shoulder: Secondary | ICD-10-CM

## 2022-04-06 DIAGNOSIS — R42 Dizziness and giddiness: Secondary | ICD-10-CM

## 2022-04-06 DIAGNOSIS — R2689 Other abnormalities of gait and mobility: Secondary | ICD-10-CM

## 2022-04-06 DIAGNOSIS — R2681 Unsteadiness on feet: Secondary | ICD-10-CM

## 2022-04-06 NOTE — Progress Notes (Signed)
? ? ?Assessment/Plan:  ? ?1.  Parkinsons Disease, by history ? -Discussed with the patient that I do not think he has Parkinson's disease.  He was seen off of levodopa today and there was no evidence of Parkinson's disease. ? -Discontinue levodopa. ? -Patient previously had a normal DaTscan in [place].  This coincides with his examination. ? -do MRI brain given episodes of R sided weakness ? ?2.  Ptosis, R>L ?            -Patient spends a lot of the visit with right eye nearly closed b/c of ptosis.  Both eyes with some ptosis.  Wife describes fatiguing at the end of the day.  Acetylcholine receptor antibodies were negative.  Still a bit suspicious for underlying NM disorder.    Order MusK (was supposed to be in last panel but wasn't).  Discussed referral to NM specialist and trying mestinon.  They decided to hold on the mestinon and see NM.  Will make appt.   ? -discussed upcoming sx.  I worry about this surgery without a definitve dx, esp if we may think that this is possibly MG.  There are lots of issues to consider with anesthesia if he did have a NMJ disorder.  They asked me to send copy of these notes to  [employer].   ?  ?3.  Syncope ? -Follow-up PCP ?  ?4.  Diabetic peripheral neuropathy, by history ?            -Did not see a lot of physical exam evidence of this today, but patient reports he was previously diagnosed with this by prior neurologist, with EMG testing. ?Subjective:  ? ?Ralph Jensen was seen today in follow up for previously diagnosed Parkinson's disease, diagnosed in [place].  My previous records were reviewed prior to todays visit as well as outside records available to me.  Last visit, I really question whether or not he had Parkinson's disease.  He did not look parkinsonian.  He previously had an normal DaTscan which really coincided with my examination.  I wanted to see him back today, off of levodopa.  He was also told to follow-up with his primary care physician about a  syncopal event he previously told me about.  I did not think that this was related to any neurologic blood pressure issue, especially since I was questioning the diagnosis.  He reports he has not done this.   He did have lab work done since our last visit as he was describing diplopia that was worse at the end of the day.  Acetylcholine receptor antibodies were negative.  Off of levodopa, he feels the same.  No vision change.  No diplopia per patient.   ? ?Current prescribed movement disorder medications: ?Carbidopa/levodopa 25/100, 1 tablet 4 times per day (held for today's visit) - told to hold for 24 hours and he has been off of it since last wed (its Monday now) ? ? ? ?ALLERGIES:   ?Allergies  ?Allergen Reactions  ? Bee Venom   ? Fluoxetine   ? Metformin And Related   ? ? ?CURRENT MEDICATIONS:  ?Current Meds  ?Medication Sig  ? atorvastatin (LIPITOR) 10 MG tablet Take 1 tablet (10 mg total) by mouth daily.  ? buPROPion (WELLBUTRIN XL) 300 MG 24 hr tablet Take 1 tablet (300 mg total) by mouth daily.  ? dapagliflozin propanediol (FARXIGA) 5 MG TABS tablet Take 1 tablet (5 mg total) by mouth daily before breakfast.  ? donepezil (  ARICEPT) 10 MG tablet Take 1 tablet (10 mg total) by mouth daily.  ? EPINEPHrine 0.3 mg/0.3 mL IJ SOAJ injection Inject 0.3 mg into the muscle as needed for anaphylaxis.  ? tamsulosin (FLOMAX) 0.4 MG CAPS capsule Take 1 capsule (0.4 mg total) by mouth daily.  ? traMADol (ULTRAM) 50 MG tablet Take 1 tablet (50 mg total) by mouth every 8 (eight) hours as needed for moderate pain.  ? ? ? ?Objective:  ? ?PHYSICAL EXAMINATION:   ? ?VITALS:   ?Vitals:  ? 04/11/22 0844  ?BP: 115/72  ?Pulse: 76  ?SpO2: 96%  ?Weight: 172 lb 9.6 oz (78.3 kg)  ?Height: 5\' 6"  (1.676 m)  ? ? ?GEN:  The patient appears stated age and is in NAD. ?HEENT:  Normocephalic, atraumatic.  The mucous membranes are moist. The superficial temporal arteries are without ropiness or tenderness. ?CV:  RRR ?Lungs:  CTAB ?Neck/HEME:   There are no carotid bruits bilaterally. ? ?Neurological examination: ? ?Orientation: The patient is alert and oriented x3. ?Cranial nerves: There is good facial symmetry with fluctuating ptosis on the R (has bilateral ptosis to some degree).  The speech is fluent and clear. Soft palate rises symmetrically and there is no tongue deviation. Hearing is intact to conversational tone. ?Sensation: Sensation is intact to light touch throughout ?Motor: Strength is at least antigravity x4. ? ?Movement examination: ?Tone: There is nl tone in the ue/le ?Abnormal movements: none ?Coordination:  There is no decremation with RAM's, with any form of RAMS, including alternating supination and pronation of the forearm, hand opening and closing, finger taps, heel taps and toe taps. ?Gait and Station: The patient has no difficulty arising out of a deep-seated chair without the use of the hands. The patient's stride length is good but he does have decreased arm swing on the right.  ? ?I have reviewed and interpreted the following labs independently ? ?  Chemistry   ?   ?Component Value Date/Time  ? NA 140 02/09/2022 0000  ? K 4.2 02/09/2022 0000  ? CL 103 02/09/2022 0000  ? CO2 24 02/09/2022 0000  ? BUN 27 (H) 02/09/2022 0000  ? CREATININE 1.10 02/09/2022 0000  ?    ?Component Value Date/Time  ? CALCIUM 10.0 02/09/2022 0000  ? AST 13 02/09/2022 0000  ? ALT 9 02/09/2022 0000  ? BILITOT 1.4 (H) 02/09/2022 0000  ?  ? ? ? ?Lab Results  ?Component Value Date  ? WBC 7.2 02/09/2022  ? HGB 17.0 02/09/2022  ? HCT 50.9 (H) 02/09/2022  ? MCV 93.1 02/09/2022  ? PLT 254 02/09/2022  ? ? ?No results found for: TSH ? ? ?Total time spent on today's visit was 9minutes, including both face-to-face time and nonface-to-face time.  Time included that spent on review of records (prior notes available to me/labs/imaging if pertinent), discussing treatment and goals, answering patient's questions and coordinating care. ? ?Cc:  Luetta Nutting, DO ? ?

## 2022-04-06 NOTE — Therapy (Signed)
[facility] ?[address] ?[place], [place], [address] ?Phone: [phone]   Fax:  [phone] ? ?Physical Therapy Treatment ? ?Patient Details  ?Name: Ralph Jensen ?MRN: [id number] ?Date of Birth: Jun 02, 1946 ?Referring Provider (PT): Dr. Dianah Field (shoulder), Dr. Zigmund Daniel (gait) ? ? ?Encounter Date: 04/06/2022 ? ? PT End of Session - 04/06/22 0802   ? ? Visit Number 13   ? Number of Visits 16   ? Date for PT Re-Evaluation 04/16/22   ? Authorization Type medicare   ? Authorization - Visit Number 13   ? Progress Note Due on Visit 10   ? PT Start Time 0802   ? PT Stop Time 0845   ? PT Time Calculation (min) 43 min   ? Activity Tolerance Patient tolerated treatment well   ? Behavior During Therapy [facility] for tasks assessed/performed   ? ?  ?  ? ?  ? ? ?Past Medical History:  ?Diagnosis Date  ? Aortic valve stenosis   ? Cognitive change   ? Diabetic acidosis, type I ([place])   ? Heart murmur   ? ? ?Past Surgical History:  ?Procedure Laterality Date  ? APPENDECTOMY    ? KIDNEY STONE SURGERY    ? SHOULDER ARTHROSCOPY WITH ROTATOR CUFF REPAIR Left   ? ? ?There were no vitals filed for this visit. ? ? Subjective Assessment - 04/06/22 0804   ? ? Subjective Pt reports he saw Dr. Griffin Basil, ortho surgeon, who recommended full shoulder athroplasty. Will hold on shoulder exercises for now.   ? Pertinent History Parkinson's Disease, diabetes.   ? Diagnostic tests Xray ordered yesterday   ? Patient Stated Goals strengthen L shoulder, improve balance   ? Pain Onset More than a month ago   ? ?  ?  ? ?  ? ? ? ? ? [facility] PT Assessment - 04/06/22 0001   ? ?  ? Assessment  ? Medical Diagnosis gait instability and chronic L shoulder pain   ? Referring Provider (PT) Dr. Dianah Field (shoulder), Dr. Zigmund Daniel (gait)   ? Onset Date/Surgical Date 02/09/22   ? Hand Dominance Right   ? ?  ?  ? ?  ? ? ? ? ? ? ? ? ? ? ? ? ? ? ? ? [facility] PT Treatment/Exercise - 04/06/22 0001   ? ?  ? Lumbar  Exercises: Stretches  ? Other Lumbar Stretch Exercise lumbar extension 2x30 sec   ?  ? Lumbar Exercises: Aerobic  ? [place] 1.4 mph x 5 min warm up   ?  ? Lumbar Exercises: Standing  ? Heel Raises 20 reps   ? Heel Raises Limitations 3 sec   ? ?  ?  ? ?  ? ? ? ? ? ? Balance Exercises - 04/06/22 0001   ? ?  ? Balance Exercises: Standing  ? Standing Eyes Closed 30 secs;Narrow base of support (BOS);Foam/compliant surface;Wide (BOA);2 reps   head turns and head nods 2x30 sec each  ? Sit to Stand Standard surface   legs spread + arms spread, feet together and then stand with arms wide x10  ? Other Standing Exercises Eyes open then closed on incline & then decline 2x30 sec each   ? Other Standing Exercises Comments standing arms wide, trunk rotation + weightshift x10   ? ?  ?  ? ?  ? ? ? ? ? ? ? PT Short Term Goals - 03/25/22 1359   ? ?  ? PT  SHORT TERM GOAL #1  ? Title The patient will be indep with HEP for L shoulder ROM, strength, as well as large amplitude movements for balance/ parkinson's.   ? Baseline Has not been consistent 03/25/22   ? Time 4   ? Period Weeks   ? Status On-going   ? Target Date 03/17/22   ?  ? PT SHORT TERM GOAL #2  ? Title The patient will be further assessed for vertigo as indicated and LTG to follow, if needed.   ? Time 4   ? Period Weeks   ? Status Achieved   ? Target Date 03/17/22   ?  ? PT SHORT TERM GOAL #3  ? Title The patient will reduce L shoulder pain to < or equal to 3/10.   ? Baseline 5/10 on 03/25/22   ? Time 4   ? Period Weeks   ? Status Partially Met   ? Target Date 03/17/22   ?  ? PT SHORT TERM GOAL #4  ? Title The patient will improve L shoulder AROM flexion to 140 degrees with pain < or equal to 2/10.   ? Baseline Full range pain free in supine on 03/25/22   ? Time 4   ? Period Weeks   ? Status Achieved   ? Target Date 03/17/22   ? ?  ?  ? ?  ? ? ? ? PT Long Term Goals - 02/15/22 1444   ? ?  ? PT LONG TERM GOAL #1  ? Title The patient will be indep with HEP progression.   ? Time  8   ? Period Weeks   ? Target Date 04/16/22   ?  ? PT LONG TERM GOAL #2  ? Title The patient will improve mini BEST test from 14/28 up to 18/28 to demo improving balance.   ? Time 8   ? Period Weeks   ? Target Date 04/16/22   ?  ? PT LONG TERM GOAL #3  ? Title The patient will be able to lift 3 lb object to overhead shelf with L UE to demo improved L shoulder strength.   ? Time 8   ? Period Weeks   ? Target Date 04/16/22   ?  ? PT LONG TERM GOAL #4  ? Title The patient will verbalize understanding of post d/c PWR! classes and POP support group for ongoing mgmt of PD.   ? Time 8   ? Period Weeks   ? Target Date 04/16/22   ? ?  ?  ? ?  ? ? ? ? ? ? ? ? Plan - 04/06/22 0821   ? ? Clinical Impression Statement Pt to get L shoulder arthroplasty -- not sure of date yet. Will place shoulder exercises on hold. Will contiue to work on balance goals. Able to tolerate initiation of PWR moves; however, LEs fatigue and requires rest breaks. Updated pt's HEP, encouraged him to walk.   ? Personal Factors and Comorbidities Comorbidity 3+   ? Comorbidities h/o arthroscopic shoulder procedures, h/o Parkinson's, diabetes   ? Examination-Activity Limitations Lift;Reach Overhead;Locomotion Level;Squat;Stairs;Stand   ? Examination-Participation Restrictions Community Activity   ? Stability/Clinical Decision Making Stable/Uncomplicated   ? Rehab Potential Good   ? PT Frequency 2x / week   ? PT Duration 8 weeks   ? PT Treatment/Interventions ADLs/Self Care Home Management;Canalith Repostioning;Vestibular;Patient/family education;Gait training;Stair training;Functional mobility training;Therapeutic activities;Therapeutic exercise;Balance training;Neuromuscular re-education;Manual techniques;Dry needling;Taping;Moist Heat;Iontophoresis 79m/ml Dexamethasone;Electrical Stimulation   ? PT Next Visit  Plan Assess for BPPV and treat accordingly. Progress vestibular & balancee exercises. Add PWR! large amplitude standing twist, standing weight  shift, and step.  L shoulder strengthening progression.   ? PT Home Exercise Plan Access Code [email or web address]   ? Consulted and Agree with Plan of Care Patient   ? ?  ?  ? ?  ? ? ?Patient will benefit from skilled therapeutic intervention in order to improve the following deficits and impairments:  Abnormal gait, Decreased activity tolerance, Decreased balance, Decreased mobility, Decreased strength, Postural dysfunction, Decreased range of motion, Increased fascial restricitons, Dizziness ? ?Visit Diagnosis: ?Acute pain of left shoulder ? ?Muscle weakness (generalized) ? ?Other abnormalities of gait and mobility ? ?Unsteadiness on feet ? ?Dizziness and giddiness ? ? ? ? ?Problem List ?Patient Active Problem List  ? Diagnosis Date Noted  ? Chronic left shoulder pain 02/14/2022  ? Gait instability 02/09/2022  ? Parkinsonism ([place]) 02/09/2022  ? Nonrheumatic aortic valve insufficiency 02/09/2022  ? Type 2 diabetes mellitus without complication, without long-term current use of insulin ([place]) 02/09/2022  ? Pure hypercholesterolemia 02/09/2022  ? Depression with anxiety 02/09/2022  ? ? ? April Gordy Levan, PT, DPT ?04/06/2022, 8:45 AM ? ?[facility] ?[address] ?[place], [place], [address] ?Phone: [phone]   Fax:  [phone] ? ?Name: Neven Fina ?MRN: [id number] ?Date of Birth: January 16, 1946 ? ? ? ?

## 2022-04-07 ENCOUNTER — Ambulatory Visit: Payer: Medicare Other | Admitting: Neurology

## 2022-04-08 LAB — HM DIABETES EYE EXAM

## 2022-04-08 NOTE — Telephone Encounter (Signed)
Document faxed to Dr. Everardo Pacific [phone]). Docs given to [employer] to contact spouse for pick-up. ?

## 2022-04-08 NOTE — Telephone Encounter (Signed)
Confirmed with patient.

## 2022-04-08 NOTE — Telephone Encounter (Signed)
Forms completed and placed in Panya's box.  Needs to be faxed to Dr. Everardo Pacific and can contact patient to pick up original.  ? ?Thanks! ? ?CM

## 2022-04-11 ENCOUNTER — Ambulatory Visit (INDEPENDENT_AMBULATORY_CARE_PROVIDER_SITE_OTHER): Payer: Medicare Other | Admitting: Neurology

## 2022-04-11 ENCOUNTER — Other Ambulatory Visit (INDEPENDENT_AMBULATORY_CARE_PROVIDER_SITE_OTHER): Payer: Medicare Other

## 2022-04-11 ENCOUNTER — Encounter: Payer: Self-pay | Admitting: Neurology

## 2022-04-11 DIAGNOSIS — H02401 Unspecified ptosis of right eyelid: Secondary | ICD-10-CM

## 2022-04-11 DIAGNOSIS — R531 Weakness: Secondary | ICD-10-CM | POA: Diagnosis not present

## 2022-04-11 NOTE — Patient Instructions (Signed)
We will do MRI brain at [facility].  They will call you to schedule ? ?Hold carbidopa/levodopa for now.   ? ?Your provider has requested that you have labwork completed today. The lab is located on the Second floor at [address], within the [facility] office. When you get off the elevator, turn right and go in the [facility] [address]; the first brown door on the left.  Tell the ladies behind the desk that you are there for lab work. If you are not called within 15 minutes please check with the front desk.  ? ?Once you complete your labs you are free to go. You will receive a call or message via MyChart with your lab results.   ? ?

## 2022-04-12 ENCOUNTER — Ambulatory Visit: Payer: Medicare Other | Admitting: Physical Therapy

## 2022-04-12 ENCOUNTER — Ambulatory Visit
Admission: RE | Admit: 2022-04-12 | Discharge: 2022-04-12 | Disposition: A | Discharge status: Home / Self Care | Payer: Medicare Other | Source: Ambulatory Visit | Attending: Orthopaedic Surgery | Admitting: Orthopaedic Surgery

## 2022-04-12 DIAGNOSIS — Z01818 Encounter for other preprocedural examination: Secondary | ICD-10-CM

## 2022-04-12 IMAGING — CT CT SHOULDER*L* W/O CM
1 of 2 series · 9 of 14 positions shown, 12 images · non-contrast
Comparison: None Available.

CLINICAL DATA: Decreased range of motion.  Preoperative planning.

EXAM:
CT OF THE UPPER LEFT EXTREMITY WITHOUT CONTRAST
TECHNIQUE: Multidetector CT imaging of the upper left extremity was performed
according to the standard protocol.
RADIATION DOSE REDUCTION: This exam was performed according to the
departmental dose-optimization program which includes automated
exposure control, adjustment of the mA and/or kV according to
patient size and/or use of iterative reconstruction technique.

[Series 5: thin soft · axial · 0.56mm/px · z∈[-195,-27]mm · 9 of 353 slices shown, 12 images]
[im 36/353  soft-tissue]
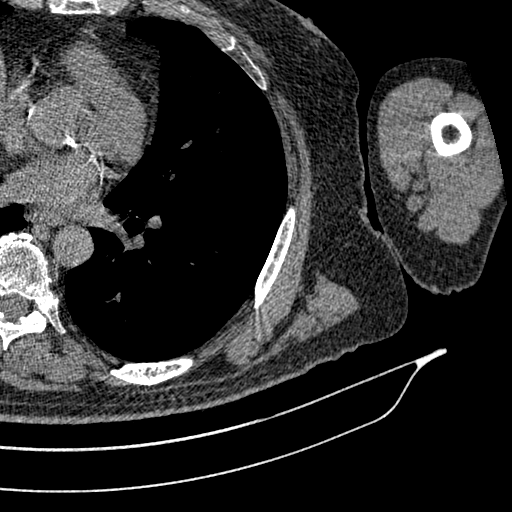
[im 36/353  bone]
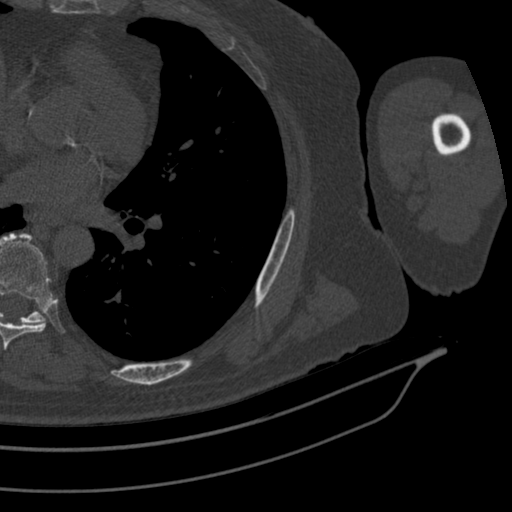
[im 71/353  bone]
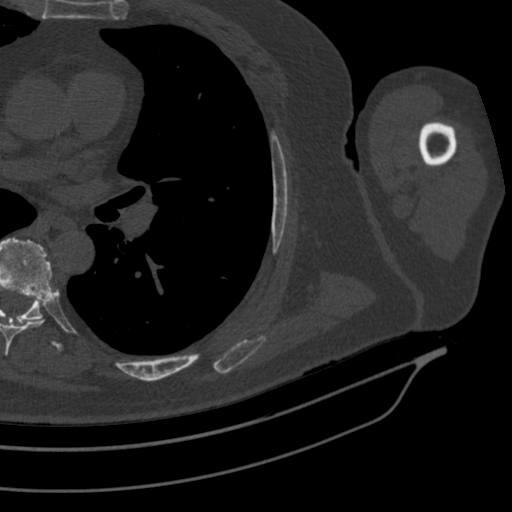
[im 106/353  bone]
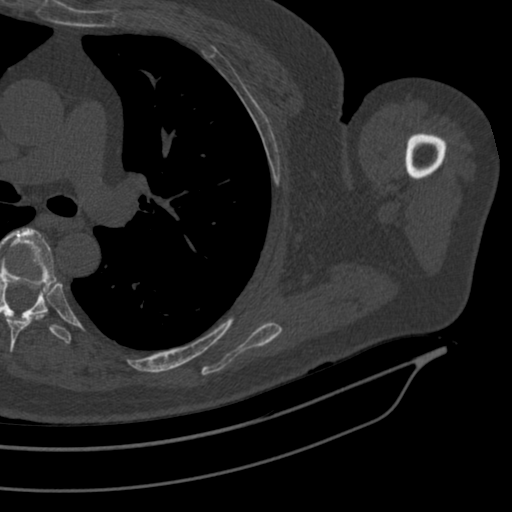
[im 141/353  bone]
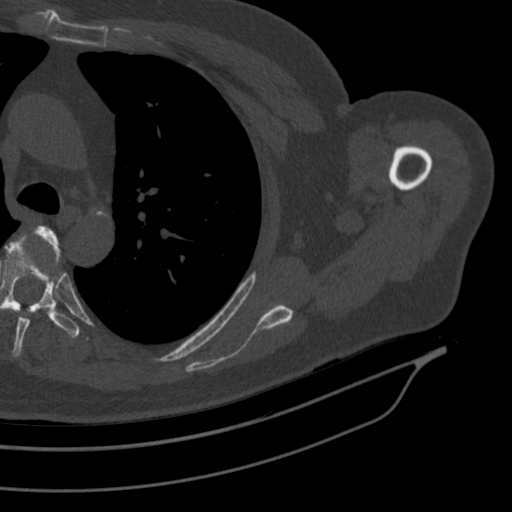
[im 177/353  soft-tissue]
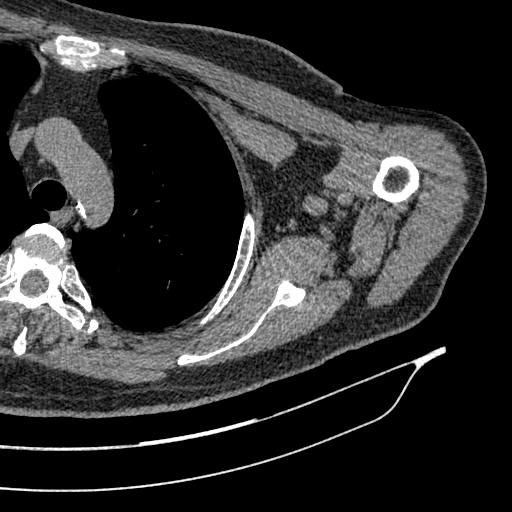
[im 177/353  bone]
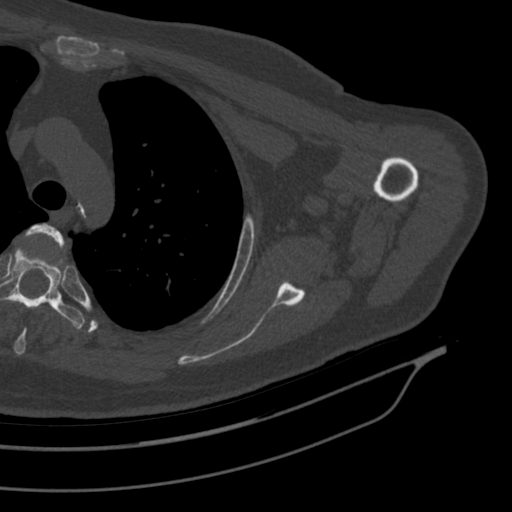
[im 212/353  bone]
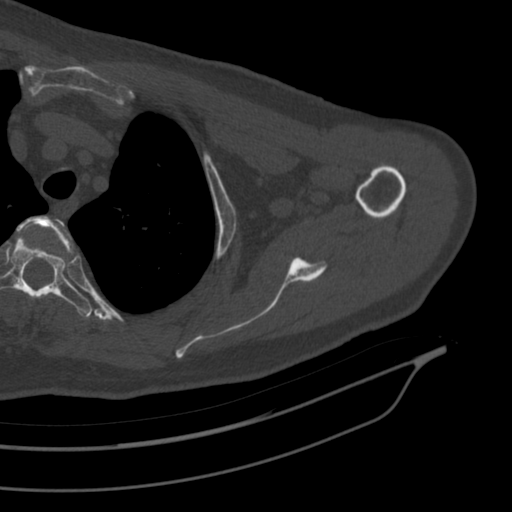
[im 247/353  bone]
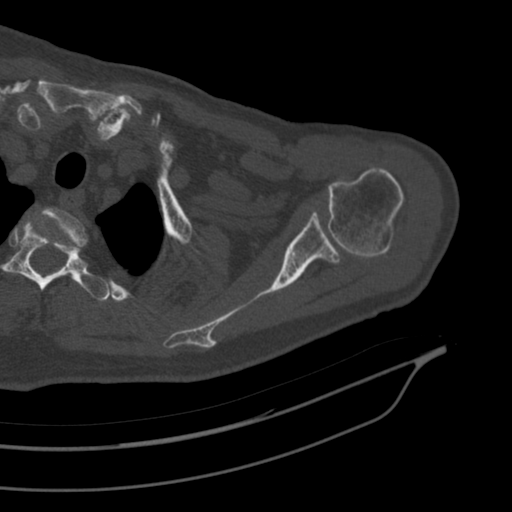
[im 282/353  bone]
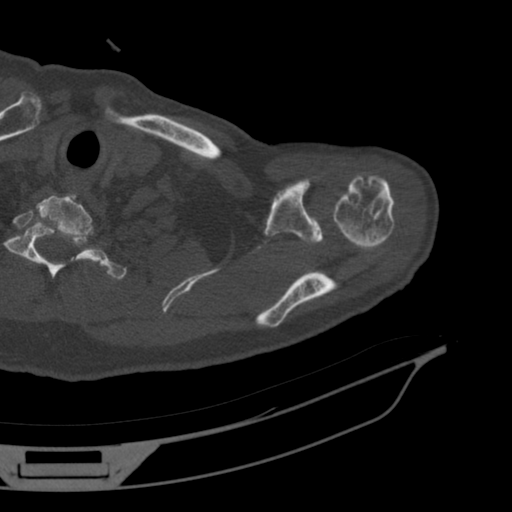
[im 317/353  soft-tissue]
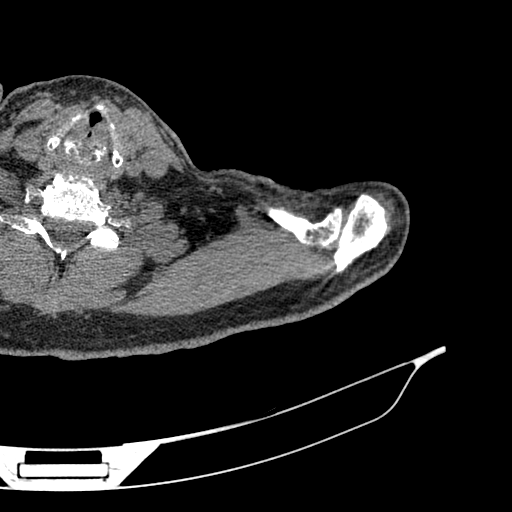
[im 317/353  bone]
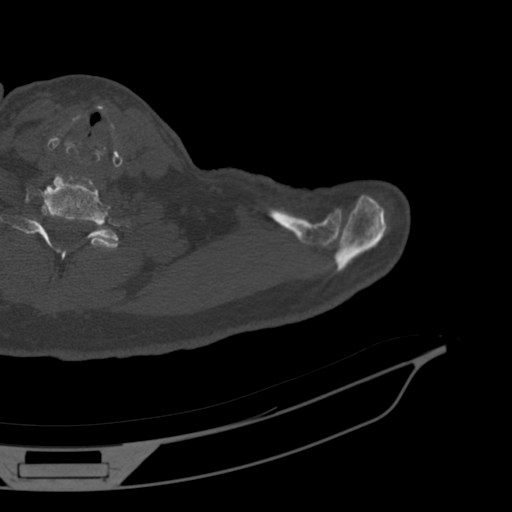

[9 of 14 positions shown; findings below may reference images not displayed]

FINDINGS: Bones/Joint/Cartilage

No fracture or dislocation. Normal alignment. No joint effusion.

Mild osteoarthritis of the glenohumeral joint. Mild arthropathy of
the acromioclavicular joint. Postsurgical changes in the humeral
head.

No aggressive osseous lesion. Left uncovertebral degenerative
changes at C5-6 and C6-7.

Ligaments

Ligaments are suboptimally evaluated by CT.

Muscles and Tendons
Muscles are normal.  No muscle atrophy. Prior biceps tenodesis.

Soft tissue
No fluid collection or hematoma. No soft tissue mass. Visualized
portions of the lung are clear. Thoracic aortic atherosclerosis.
Multi vessel coronary artery atherosclerosis.
IMPRESSION: 1. Mild osteoarthritis of the glenohumeral joint.
2. Mild arthropathy of the acromioclavicular joint.
3. Prior biceps tenodesis.

## 2022-04-17 LAB — MUSK ANTIBODIES

## 2022-04-19 ENCOUNTER — Ambulatory Visit: Payer: Medicare Other | Admitting: Physical Therapy

## 2022-04-19 DIAGNOSIS — R2681 Unsteadiness on feet: Secondary | ICD-10-CM

## 2022-04-19 DIAGNOSIS — R2689 Other abnormalities of gait and mobility: Secondary | ICD-10-CM

## 2022-04-19 DIAGNOSIS — M6281 Muscle weakness (generalized): Secondary | ICD-10-CM

## 2022-04-19 DIAGNOSIS — M25512 Pain in left shoulder: Secondary | ICD-10-CM | POA: Diagnosis not present

## 2022-04-19 DIAGNOSIS — R42 Dizziness and giddiness: Secondary | ICD-10-CM

## 2022-04-20 NOTE — Therapy (Signed)
[place], [place], [address] Phone: [phone]   Fax:  [phone]  Physical Therapy Treatment  Patient Details  Name: Ralph Jensen MRN: [id number] Date of Birth: 1946/10/10 Referring Provider (PT): Dr. Dianah Field (shoulder), Dr. Zigmund Daniel (gait)   Encounter Date: 04/19/2022   PT End of Session - 04/19/22 1316     Visit Number 14    Number of Visits 16    Date for PT Re-Evaluation 04/16/22    Authorization Type medicare    Authorization - Visit Number 14    Progress Note Due on Visit 20    PT Start Time 1316    PT Stop Time 1400    PT Time Calculation (min) 44 min    Activity Tolerance Patient tolerated treatment well    Behavior During Therapy [facility] for tasks assessed/performed             Past Medical History:  Diagnosis Date   Aortic valve stenosis    Cognitive change    Diabetic acidosis, type I ([facility])    Heart murmur     Past Surgical History:  Procedure Laterality Date   APPENDECTOMY     KIDNEY STONE SURGERY     SHOULDER ARTHROSCOPY WITH ROTATOR CUFF REPAIR Left     There were no vitals filed for this visit.   Subjective Assessment - 04/19/22 1317     Subjective Pt states his vertigo is back -- feels it with up/down motions. Pt reports he wasn't feeling good last week. Pt states he was very lethargic. Pt saw Dr. Carles Collet and wants to delay his shoulder surgery until he gets further work up for potentially myasthenia gravis. Pt states that Dr. Carles Collet doesn't believe he has PD -- not a clear cut diagnosis. Pt states he put himself back on the levodopa and has been feeling better but thinks it might be due to the fact he has been on it for so long. Pt states he tries to do his balance exercises.    Pertinent History Parkinson's Disease, diabetes.    Diagnostic tests Xray ordered yesterday    Patient Stated Goals strengthen L shoulder, improve balance    Currently in Pain?  No/denies    Pain Onset More than a month ago                [facility] PT Assessment - 04/20/22 0001       Transfers   Five time sit to stand comments  12 sec      Mini-BESTest   Sit To Stand Normal: Comes to stand without use of hands and stabilizes independently.    Rise to Toes Moderate: Heels up, but not full range (smaller than when holding hands), OR noticeable instability for 3 s.    Stand on one leg (left) Moderate: < 20 s    Stand on one leg (right) Moderate: < 20 s    Stand on one leg - lowest score 1    Compensatory Stepping Correction - Forward Normal: Recovers independently with a single, large step (second realignement is allowed).    Compensatory Stepping Correction - Backward Normal: Recovers independently with a single, large step    Compensatory Stepping Correction - Left Lateral Moderate: Several steps to recover equilibrium    Compensatory Stepping Correction - Right Lateral Moderate: Several steps to recover equilibrium    Stepping Corredtion Lateral - lowest score 1    Stance - Feet together, eyes open, firm surface  Normal: 30s    Stance - Feet together, eyes closed, foam surface  Moderate: < 30s    Incline - Eyes Closed Moderate: Stands independently < 30s OR aligns with surface   25 sec   Change in Gait Speed Normal: Significantly changes walkling speed without imbalance    Walk with head turns - Horizontal Moderate: performs head turns with reduction in gait speed.    Walk with pivot turns Normal: Turns with feet close FAST (< 3 steps) with good balance.    Step over obstacles Normal: Able to step over box with minimal change of gait speed and with good balance.    Timed UP & GO with Dual Task Normal: No noticeable change in sitting, standing or walking while backward counting when compared to TUG without    Mini-BEST total score 22      Timed Up and Go Test   Normal TUG (seconds) 7    Cognitive TUG (seconds) 8                 Vestibular  Assessment - 04/20/22 0001       Dix-Hallpike Right   Dix-Hallpike Right Duration 0      Dix-Hallpike Left   Dix-Hallpike Left Duration ~5 sec    Dix-Hallpike Left Symptoms Upbeat, left rotatory nystagmus      Horizontal Canal Right   Horizontal Canal Right Duration 0      Horizontal Canal Left   Horizontal Canal Left Duration 0                                 PT Short Term Goals - 03/25/22 1359       PT SHORT TERM GOAL #1   Title The patient will be indep with HEP for L shoulder ROM, strength, as well as large amplitude movements for balance/ parkinson's.    Baseline Has not been consistent 03/25/22    Time 4    Period Weeks    Status On-going    Target Date 03/17/22      PT SHORT TERM GOAL #2   Title The patient will be further assessed for vertigo as indicated and LTG to follow, if needed.    Time 4    Period Weeks    Status Achieved    Target Date 03/17/22      PT SHORT TERM GOAL #3   Title The patient will reduce L shoulder pain to < or equal to 3/10.    Baseline 5/10 on 03/25/22    Time 4    Period Weeks    Status Partially Met    Target Date 03/17/22      PT SHORT TERM GOAL #4   Title The patient will improve L shoulder AROM flexion to 140 degrees with pain < or equal to 2/10.    Baseline Full range pain free in supine on 03/25/22    Time 4    Period Weeks    Status Achieved    Target Date 03/17/22             Prior LTGs:    PT Long Term Goals - 02/15/22 1444       PT LONG TERM GOAL #1   Title The patient will be indep with HEP progression.    Time 8    Period Weeks    Target Date 04/16/22      PT LONG TERM GOAL #  2   Title The patient will improve mini BEST test from 14/28 up to 18/28 to demo improving balance.    Time 8    Period Weeks    Target Date 04/16/22      PT LONG TERM GOAL #3   Title The patient will be able to lift 3 lb object to overhead shelf with L UE to demo improved L shoulder strength.    Time 8     Period Weeks    Target Date 04/16/22      PT LONG TERM GOAL #4   Title The patient will verbalize understanding of post d/c PWR! classes and POP support group for ongoing mgmt of PD.    Time 8    Period Weeks    Target Date 04/16/22              Revised:   PT Long Term Goals - 04/20/22 1448       PT LONG TERM GOAL #1   Title The patient will be indep with HEP progression.    Time 6    Period Weeks    Status On-going    Target Date 06/01/22      PT LONG TERM GOAL #2   Title The patient will improve mini BEST test from 20/28 up to 24/28 to demo improving balance.    Baseline 20/28 on 5/24    Time 6    Period Weeks    Status Revised    Target Date 06/01/22      PT LONG TERM GOAL #3   Title The patient will be able to lift 3 lb object to overhead shelf with L UE to demo improved L shoulder strength.    Baseline Pt to get surgery    Time 8    Period Weeks    Status Deferred    Target Date 04/16/22      PT LONG TERM GOAL #4   Title The patient will be able to maintain walking program at home    Time 6    Period Weeks    Status Revised    Target Date 06/01/22                   Plan - 04/19/22 1356     Clinical Impression Statement Pt with re-exacerbation of BPPV -- found to be (+) for L posterior canalithiasis. Provided Epley maneuver today. Laruth Bouchard Daroff exercises to address at home. Re-checked pt's LTGs -- has met his mini BESTest goal; however, has deficits with his balance that could benefit from continued therapy. Discussed dropping to 1x/wk for the next 6 weeks. Deferred pt's shoulder goals due to pt is to get surgery with Dr. Griffin Basil but is currently waiting for other medical clearances.    Personal Factors and Comorbidities Comorbidity 3+    Comorbidities h/o arthroscopic shoulder procedures, h/o Parkinson's, diabetes    Examination-Activity Limitations Lift;Reach Overhead;Locomotion Level;Squat;Stairs;Stand    Examination-Participation  Restrictions Community Activity    Stability/Clinical Decision Making Stable/Uncomplicated    Rehab Potential Good    PT Frequency 1x / week    PT Duration 6 weeks    PT Treatment/Interventions ADLs/Self Care Home Management;Canalith Repostioning;Vestibular;Patient/family education;Gait training;Stair training;Functional mobility training;Therapeutic activities;Therapeutic exercise;Balance training;Neuromuscular re-education;Manual techniques;Dry needling;Taping;Moist Heat;Iontophoresis 9m/ml Dexamethasone;Electrical Stimulation    PT Next Visit Plan Assess for BPPV and treat accordingly. Progress vestibular & balancee exercises. Add PWR! large amplitude standing twist, standing weight shift, and step.    PT Home Exercise Plan  Access Code [email or web address]    [email or web address] and Agree with Plan of Care Patient             Patient will benefit from skilled therapeutic intervention in order to improve the following deficits and impairments:  Abnormal gait, Decreased activity tolerance, Decreased balance, Decreased mobility, Decreased strength, Postural dysfunction, Decreased range of motion, Increased fascial restricitons, Dizziness  Visit Diagnosis: Acute pain of left shoulder  Muscle weakness (generalized)  Other abnormalities of gait and mobility  Unsteadiness on feet  Dizziness and giddiness     Problem List Patient Active Problem List   Diagnosis Date Noted   Chronic left shoulder pain 02/14/2022   Gait instability 02/09/2022   Parkinsonism ([place]) 02/09/2022   Nonrheumatic aortic valve insufficiency 02/09/2022   Type 2 diabetes mellitus without complication, without long-term current use of insulin ([place]) 02/09/2022   Pure hypercholesterolemia 02/09/2022   Depression with anxiety 02/09/2022    [facility] April Gordy Levan, PT, DPT 04/20/2022, 4:44 PM  [facility] [place], [place], [address] Phone: [phone]    Fax:  [phone]  Name: Ralph Jensen MRN: [id number] Date of Birth: 1946/09/03

## 2022-04-27 ENCOUNTER — Ambulatory Visit (INDEPENDENT_AMBULATORY_CARE_PROVIDER_SITE_OTHER): Payer: Medicare Other | Admitting: Sports Medicine

## 2022-04-27 DIAGNOSIS — M25512 Pain in left shoulder: Secondary | ICD-10-CM

## 2022-04-27 DIAGNOSIS — G8929 Other chronic pain: Secondary | ICD-10-CM | POA: Diagnosis not present

## 2022-04-27 NOTE — Progress Notes (Signed)
    Procedures performed today:    None.  Independent interpretation of notes and tests performed by another provider:   None.  Brief History, Exam, Impression, and Recommendations:    Chronic left shoulder pain Pleasant 76 year old male, referred to Dr. Everardo Pacific after failure of conservative treatment, he is getting a shoulder arthroplasty. 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 I did tell him we needed to avoid injections prior to his surgery within at least 6 to 12 weeks.    ___________________________________________ Ihor Austin. Benjamin Stain, M.D., ABFM., CAQSM. Primary Care and Sports Medicine [facility]  Adjunct Instructor of Family Medicine  [employer]

## 2022-04-27 NOTE — Assessment & Plan Note (Signed)
Pleasant 76 year old male, referred to Dr. Everardo Pacific after failure of conservative treatment, he is getting a shoulder arthroplasty. 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 I did tell him we needed to avoid injections prior to his surgery within at least 6 to 12 weeks.

## 2022-05-02 ENCOUNTER — Ambulatory Visit (INDEPENDENT_AMBULATORY_CARE_PROVIDER_SITE_OTHER): Payer: Medicare Other

## 2022-05-02 DIAGNOSIS — R262 Difficulty in walking, not elsewhere classified: Secondary | ICD-10-CM | POA: Diagnosis not present

## 2022-05-02 DIAGNOSIS — R410 Disorientation, unspecified: Secondary | ICD-10-CM | POA: Diagnosis not present

## 2022-05-02 DIAGNOSIS — R531 Weakness: Secondary | ICD-10-CM

## 2022-05-02 IMAGING — MR MR HEAD W/O CM
10 series · 48 of 48 positions shown · non-contrast
Comparison: None

CLINICAL DATA: Right-sided weakness. Difficulty walking and
confusion.

EXAM:
MRI HEAD WITHOUT CONTRAST
TECHNIQUE: Multiplanar, multiecho pulse sequences of the brain and surrounding
structures were obtained without intravenous contrast.

[Series 2: DWI · axial · 3.0mm · 1.46mm/px · z∈[-32,+128]mm · 9 of 110 slices shown (1 of 4)]
[im 1/110]
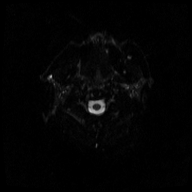
[im 14/110]
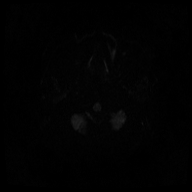
[im 28/110]
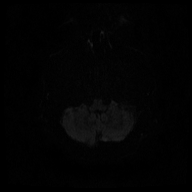
[im 41/110]
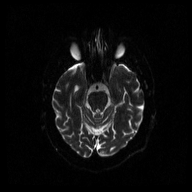
[im 55/110]
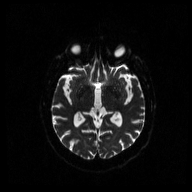
[im 69/110]
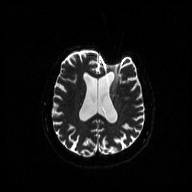
[im 82/110]
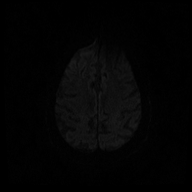
[im 96/110]
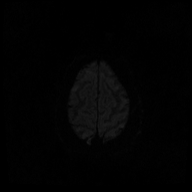
[im 110/110]
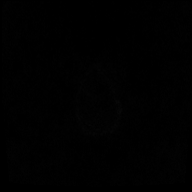

[Series 3: DWI · axial · 3.0mm · 1.46mm/px · z∈[-32,+128]mm · 5 of 55 slices shown (2 of 4)]
[im 1/55]
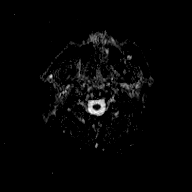
[im 14/55]
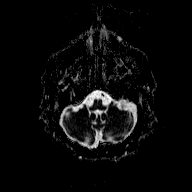
[im 28/55]
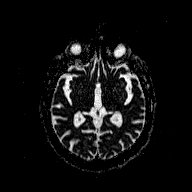
[im 41/55]
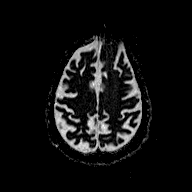
[im 55/55]
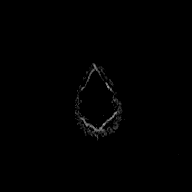

[Series 4: DWI · coronal · 5.0mm · 1.46mm/px · 5 of 60 slices shown (3 of 4)]
[im 1/60]
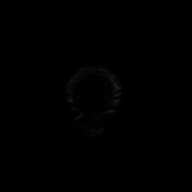
[im 15/60]
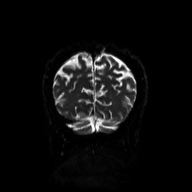
[im 30/60]
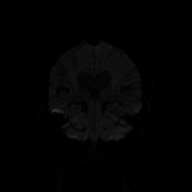
[im 45/60]
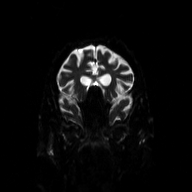
[im 60/60]
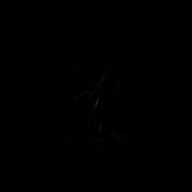

[Series 5: DWI · coronal · 5.0mm · 1.46mm/px · 3 of 32 slices shown (4 of 4)]
[im 1/32]
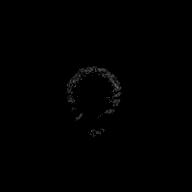
[im 16/32]
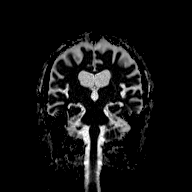
[im 32/32]
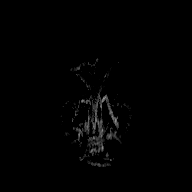

[Series 6: T1 · sagittal · 5.0mm · 0.45mm/px · 2 of 25 slices shown (1 of 2)]
[im 1/25]
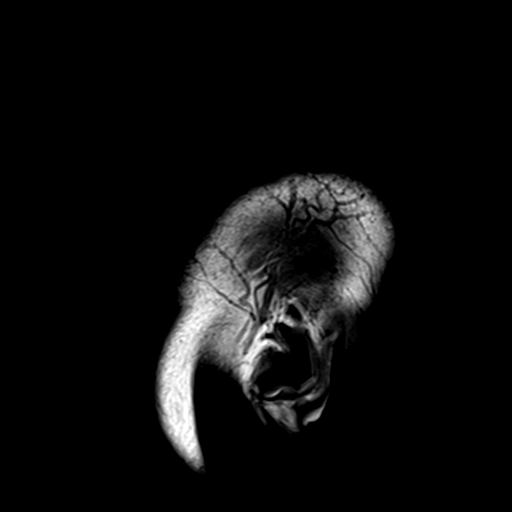
[im 25/25]
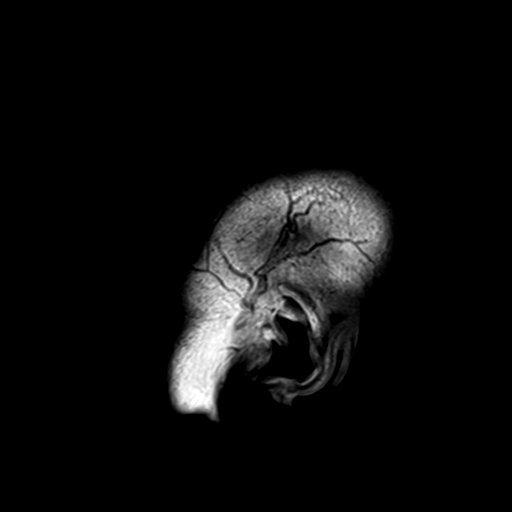

[Series 7: T2 · axial · 5.0mm · 0.72mm/px · z∈[-30,+129]mm · 2 of 24 slices shown (1 of 3)]
[im 1/24]
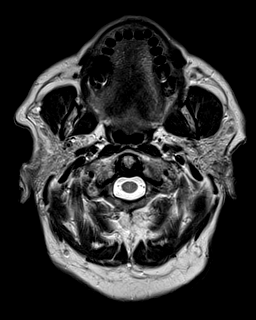
[im 24/24]
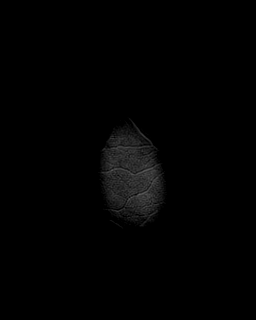

[Series 9: T2 · axial · 5.0mm · 0.72mm/px · z∈[-30,+129]mm · 2 of 24 slices shown (2 of 3)]
[im 1/24]
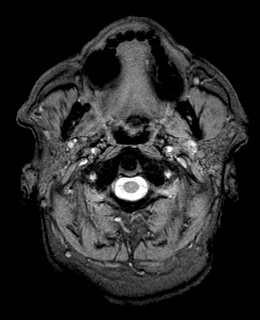
[im 24/24]
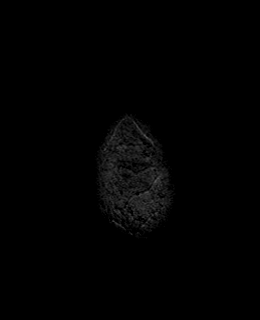

[Series 11: T2 · coronal · 5.0mm · 0.43mm/px · 2 of 29 slices shown (3 of 3)]
[im 1/29]
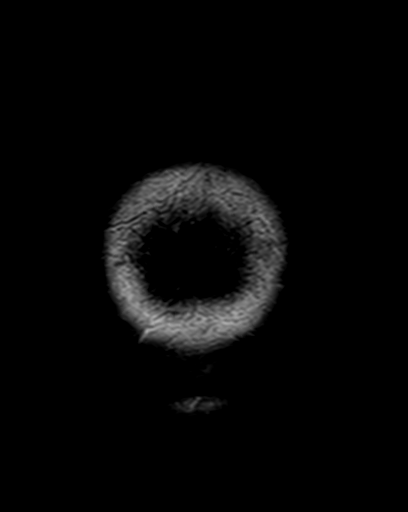
[im 29/29]
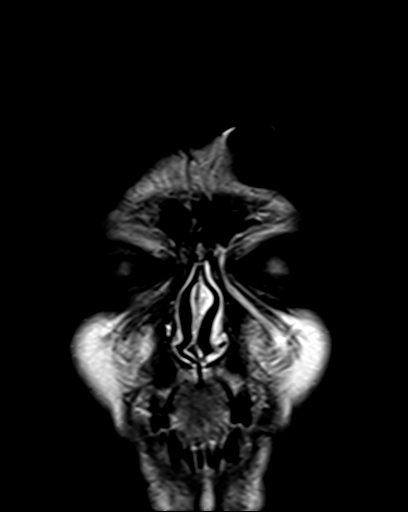

[Series 12: t2_tirm_tra_dark-fluid · axial · 3.0mm · 0.45mm/px · z∈[-33,+132]mm · 4 of 43 slices shown]
[im 1/43]
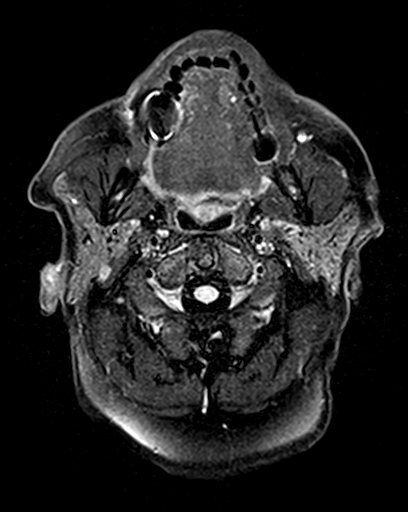
[im 15/43]
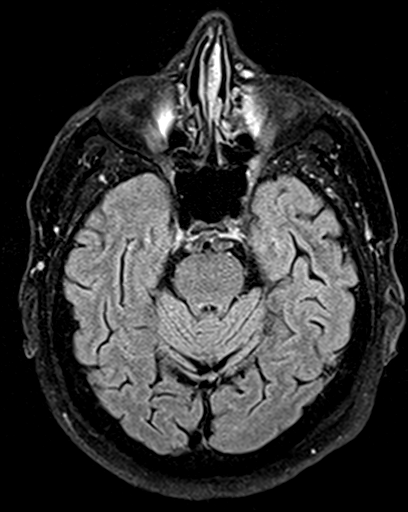
[im 29/43]
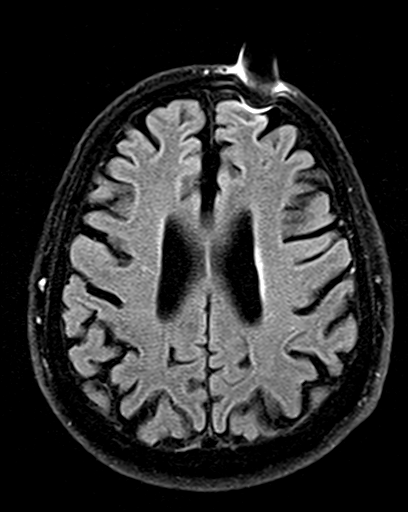
[im 43/43]
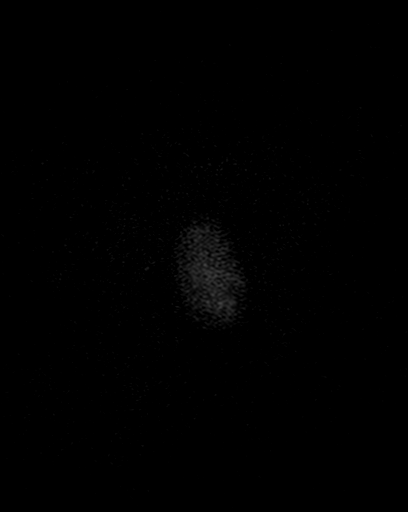

[Series 13: T1 · axial · 1.0mm · 0.94mm/px · z∈[-28,+129]mm · 14 of 160 slices shown (2 of 2)]
[im 1/160]
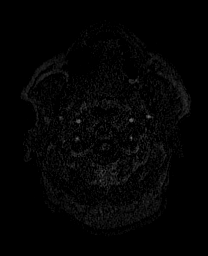
[im 13/160]
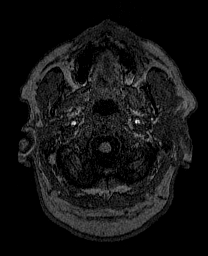
[im 25/160]
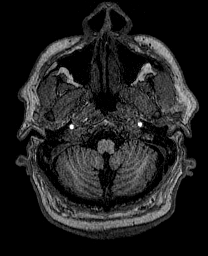
[im 37/160]
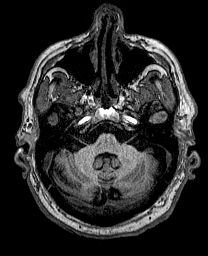
[im 49/160]
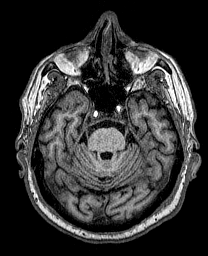
[im 62/160]
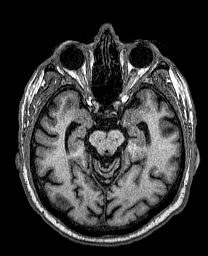
[im 74/160]
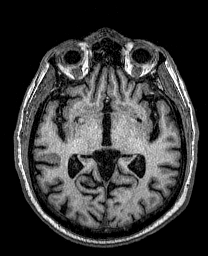
[im 86/160]
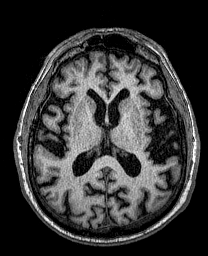
[im 98/160]
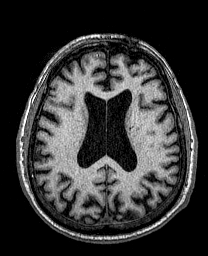
[im 111/160]
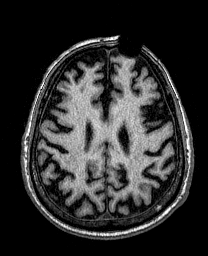
[im 123/160]
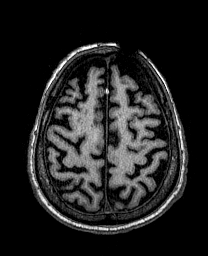
[im 135/160]
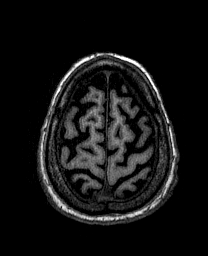
[im 147/160]
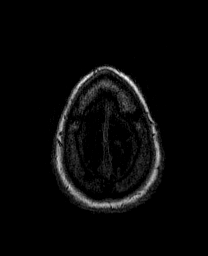
[im 160/160]
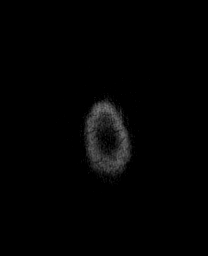

[48 of 48 positions shown; findings below may reference images not displayed]

FINDINGS: Brain: Generalized age related volume loss. No focal abnormality
seen affecting the brainstem or cerebellum. Cerebral hemispheres
show minimal/mild chronic small-vessel ischemic change of the white
matter. No cortical or large vessel territory infarction. No mass
lesion, hemorrhage, hydrocephalus or extra-axial collection.

Vascular: Major vessels at the base of the brain show flow.

Skull and upper cervical spine: Negative

Sinuses/Orbits: Clear/normal

Other: Question ferromagnetic foreign object in the left frontal
scalp with subsequent artifact.
IMPRESSION: No acute or reversible finding. Generalized age related volume loss.
Minimal/mild chronic small-vessel ischemic change of the cerebral
hemispheric white matter.

Probable ferromagnetic foreign object in the left frontal scalp with
subsequent artifact.

## 2022-05-03 ENCOUNTER — Telehealth: Payer: Self-pay | Admitting: Neurology

## 2022-05-03 NOTE — Telephone Encounter (Signed)
Reviewed.  Mild WMD.  Let pt know that MRI brain looks okay.  Radiology did not that it looks like he may have a foreign body/object in the scalp on the L frontal area.  This is far out of my area but could be from an accident/working with steel/metal, etc and he can f/u with PCP to see if anything further to be done in that regard.

## 2022-05-03 NOTE — Telephone Encounter (Signed)
Called patient and delivered results patient is seeing PCP tomorrow

## 2022-05-04 ENCOUNTER — Ambulatory Visit: Payer: Medicare Other | Attending: Sports Medicine | Admitting: Physical Therapy

## 2022-05-04 ENCOUNTER — Encounter: Payer: Self-pay | Admitting: Family Medicine

## 2022-05-04 ENCOUNTER — Ambulatory Visit (INDEPENDENT_AMBULATORY_CARE_PROVIDER_SITE_OTHER): Payer: Medicare Other | Admitting: Family Medicine

## 2022-05-04 VITALS — BP 100/62 | HR 60 | Ht 66.0 in | Wt 172.0 lb

## 2022-05-04 DIAGNOSIS — R2689 Other abnormalities of gait and mobility: Secondary | ICD-10-CM | POA: Insufficient documentation

## 2022-05-04 DIAGNOSIS — M6281 Muscle weakness (generalized): Secondary | ICD-10-CM | POA: Insufficient documentation

## 2022-05-04 DIAGNOSIS — E7800 Pure hypercholesterolemia, unspecified: Secondary | ICD-10-CM | POA: Diagnosis not present

## 2022-05-04 DIAGNOSIS — E119 Type 2 diabetes mellitus without complications: Secondary | ICD-10-CM

## 2022-05-04 DIAGNOSIS — G20 Parkinson's disease: Secondary | ICD-10-CM

## 2022-05-04 DIAGNOSIS — F418 Other specified anxiety disorders: Secondary | ICD-10-CM

## 2022-05-04 DIAGNOSIS — R2681 Unsteadiness on feet: Secondary | ICD-10-CM | POA: Insufficient documentation

## 2022-05-04 DIAGNOSIS — R42 Dizziness and giddiness: Secondary | ICD-10-CM | POA: Insufficient documentation

## 2022-05-04 MED ORDER — BUPROPION HCL ER (XL) 150 MG PO TB24: 150.0000 mg | ORAL_TABLET | Freq: Every day | ORAL | 3 refills | Status: DC

## 2022-05-04 NOTE — Assessment & Plan Note (Signed)
Continue atorvastatin at current strength.  

## 2022-05-04 NOTE — Assessment & Plan Note (Signed)
Blood sugars have been well controlled.  They prefer that this be managed by endocrinology.  Referral placed to [facility] endocrinology

## 2022-05-04 NOTE — Assessment & Plan Note (Signed)
This is essentially been ruled out by neurology.  He does have additional work-up to rule out myasthenia.  He prefers to stay on Sinemet at this time.

## 2022-05-04 NOTE — Assessment & Plan Note (Signed)
Seems to be doing well with an additional 150 mg of bupropion XL in the evening.  We will continue at 300 in the morning with an additional 150 in the evening.

## 2022-05-04 NOTE — Progress Notes (Signed)
Ralph Jensen - 76 y.o. male MRN [id number]  Date of birth: 17-May-1946  Subjective Chief Complaint  Patient presents with   Follow-up    HPI Ralph Jensen is a 76 year old male here today for follow-up visit.  He is accompanied by his wife today.  Doing pretty well at this time.  Requesting new referral to endocrinology is interested in seeing a physician rather than a nurse practitioner.  His diabetes has been pretty well controlled with Farxiga at current strength.  Continues to see neurology.  He does not seem that he has Parkinson's disease him.  He was told that he can discontinue Sinemet however he prefers to continue this, feels like it may help some.  He does have additional testing to rule out myasthenia gravis.  Surgery for his shoulder has been postponed until this further testing has been completed.  He was a little more irritable and his wife began giving him the 150 mg of bupropion in the evening as an addition to the 300 mg he was taking in the morning.  This seems to have improved his mood.  They would like to continue this.  ROS:  A comprehensive ROS was completed and negative except as noted per HPI The feel comfortable driving [employer] he drove to [place] when but [place] before home sorry about you down very capable of driving you do not like driving or driving her dyspnea Allergies  Allergen Reactions   Bee Venom    Fluoxetine    Metformin And Related     Past Medical History:  Diagnosis Date   Aortic valve stenosis    Cognitive change    Diabetic acidosis, type I (HCC)    Heart murmur     Past Surgical History:  Procedure Laterality Date   APPENDECTOMY     KIDNEY STONE SURGERY     SHOULDER ARTHROSCOPY WITH ROTATOR CUFF REPAIR Left     Social History   Socioeconomic History   Marital status: Married    Spouse name: Not on file   Number of children: Not on file   Years of education: Not on file   Highest education level: Not on file  Occupational History    Occupation: retired    Comment: Paramedic  Tobacco Use   Smoking status: Never    Passive exposure: Never   Smokeless tobacco: Never  Vaping Use   Vaping Use: Never used  Substance and Sexual Activity   Alcohol use: Never   Drug use: Never   Sexual activity: Not Currently    Partners: Female  Other Topics Concern   Not on file  Social History Narrative   Right handed   Retired   Lives with wife   Social Determinants of Health   Financial Resource Strain: Not on file  Food Insecurity: Not on file  Transportation Needs: Not on file  Physical Activity: Not on file  Stress: Not on file  Social Connections: Not on file    Family History  Problem Relation Age of Onset   Heart attack Mother     Health Maintenance  Topic Date Due   FOOT EXAM  Never done   OPHTHALMOLOGY EXAM  Never done   URINE MICROALBUMIN  Never done   Hepatitis C Screening  Never done   COVID-19 Vaccine (5 - Booster for Moderna series) 05/10/2021   Pneumonia Vaccine 24+ Years old (1 - PCV) 02/10/2023 (Originally 07/02/2010)   TETANUS/TDAP  02/10/2023 (Originally 07/02/1964)   INFLUENZA VACCINE  06/28/2022  HEMOGLOBIN A1C  08/12/2022   Zoster Vaccines- Shingrix  Completed   HPV VACCINES  Aged Out     ----------------------------------------------------------------------------------------------------------------------------------------------------------------------------------------------------------------- Physical Exam BP 100/62 (BP Location: Left Arm, Patient Position: Sitting, Cuff Size: Normal)   Pulse 60   Ht 5\' 6"  (1.676 m)   Wt 172 lb (78 kg)   SpO2 96%   BMI 27.76 kg/m   Physical Exam Constitutional:      Appearance: Normal appearance.  Eyes:     General: No scleral icterus. Cardiovascular:     Rate and Rhythm: Normal rate and regular rhythm.  Pulmonary:     Effort: Pulmonary effort is normal.     Breath sounds: Normal breath sounds.  Musculoskeletal:      Cervical back: Neck supple.  Neurological:     General: No focal deficit present.     Mental Status: He is alert.  Psychiatric:        Mood and Affect: Mood normal.        Behavior: Behavior normal.    ------------------------------------------------------------------------------------------------------------------------------------------------------------------------------------------------------------------- Assessment and Plan  Type 2 diabetes mellitus without complication, without long-term current use of insulin (HCC) Blood sugars have been well controlled.  They prefer that this be managed by endocrinology.  Referral placed to [place] endocrinology  Parkinsonism [facility]) This is essentially been ruled out by neurology.  He does have additional work-up to rule out myasthenia.  He prefers to stay on Sinemet at this time.  Depression with anxiety Seems to be doing well with an additional 150 mg of bupropion XL in the evening.  We will continue at 300 in the morning with an additional 150 in the evening.  Pure hypercholesterolemia Continue atorvastatin at current strength.   Meds ordered this encounter  Medications   buPROPion (WELLBUTRIN XL) 150 MG 24 hr tablet    Sig: Take 1 tablet (150 mg total) by mouth daily. Take in the evening in addition to 300mg  to make total of 450mg /day    Dispense:  90 tablet    Refill:  3    Return in about 6 months (around 11/03/2022) for MDD.    This visit occurred during the SARS-CoV-2 public health emergency.  Safety protocols were in place, including screening questions prior to the visit, additional usage of staff PPE, and extensive cleaning of exam room while observing appropriate contact time as indicated for disinfecting solutions. ,mnp

## 2022-05-05 ENCOUNTER — Other Ambulatory Visit: Payer: Self-pay | Admitting: Sports Medicine

## 2022-05-05 ENCOUNTER — Encounter: Payer: Self-pay | Admitting: Family Medicine

## 2022-05-05 DIAGNOSIS — G8929 Other chronic pain: Secondary | ICD-10-CM

## 2022-05-17 ENCOUNTER — Ambulatory Visit: Payer: Medicare Other | Admitting: Physical Therapy

## 2022-05-17 DIAGNOSIS — R2689 Other abnormalities of gait and mobility: Secondary | ICD-10-CM

## 2022-05-17 DIAGNOSIS — R2681 Unsteadiness on feet: Secondary | ICD-10-CM

## 2022-05-17 DIAGNOSIS — M6281 Muscle weakness (generalized): Secondary | ICD-10-CM

## 2022-05-17 DIAGNOSIS — R42 Dizziness and giddiness: Secondary | ICD-10-CM | POA: Diagnosis present

## 2022-05-17 NOTE — Therapy (Signed)
[facility] [place], [place], [address] Phone: [phone]   Fax:  [phone]  Physical Therapy Treatment  Patient Details  Name: Ralph Jensen MRN: [id number] Date of Birth: 1946/06/07 Referring Provider (PT): Dr. Dianah Field (shoulder), Dr. Zigmund Daniel (gait)   Encounter Date: 05/17/2022   PT End of Session - 05/17/22 1059     Visit Number 15    Number of Visits 16    Date for PT Re-Evaluation 06/01/22    Authorization Type medicare    Authorization - Visit Number 15    Progress Note Due on Visit 20    PT Start Time 6606    PT Stop Time 1058    PT Time Calculation (min) 43 min    Activity Tolerance Patient tolerated treatment well    Behavior During Therapy [facility] for tasks assessed/performed             Past Medical History:  Diagnosis Date   Aortic valve stenosis    Cognitive change    Diabetic acidosis, type I ([place])    Heart murmur     Past Surgical History:  Procedure Laterality Date   APPENDECTOMY     KIDNEY STONE SURGERY     SHOULDER ARTHROSCOPY WITH ROTATOR CUFF REPAIR Left     There were no vitals filed for this visit.   Subjective Assessment - 05/17/22 1019     Subjective Visited Dr. Zigmund Daniel and recommended to see endocrinologist. Pt had MRI and everything looked okay. Will get follow up at the end of August for possible myasthenia gravis. Pt states he has been walking daily.                [facility] PT Assessment - 05/17/22 0001       Transfers   Five time sit to stand comments  10 sec      Mini-BESTest   Sit To Stand Normal: Comes to stand without use of hands and stabilizes independently.    Rise to Toes Normal: Stable for 3 s with maximum height.    Stand on one leg (left) Moderate: < 20 s   12 sec   Stand on one leg (right) Moderate: < 20 s   9 sec   Stand on one leg - lowest score 1    Compensatory Stepping Correction - Forward Normal: Recovers independently  with a single, large step (second realignement is allowed).    Compensatory Stepping Correction - Backward Moderate: More than one step is required to recover equilibrium    Compensatory Stepping Correction - Left Lateral Normal: Recovers independently with 1 step (crossover or lateral OK)    Compensatory Stepping Correction - Right Lateral Normal: Recovers independently with 1 step (crossover or lateral OK)    Stepping Corredtion Lateral - lowest score 2    Stance - Feet together, eyes open, firm surface  Normal: 30s    Stance - Feet together, eyes closed, foam surface  Normal: 30s   after 3 tries   Incline - Eyes Closed Normal: Stands independently 30s and aligns with gravity    Change in Gait Speed Normal: Significantly changes walkling speed without imbalance    Walk with head turns - Horizontal Normal: performs head turns with no change in gait speed and good balance    Walk with pivot turns Normal: Turns with feet close FAST (< 3 steps) with good balance.    Step over obstacles Normal: Able to step over box with minimal change  of gait speed and with good balance.    Timed UP & GO with Dual Task Moderate: Dual Task affects either counting OR walking (>10%) when compared to the TUG without Dual Task.    Mini-BEST total score 25      Timed Up and Go Test   Normal TUG (seconds) 7    Cognitive TUG (seconds) 9   calculations were wrong                Vestibular Assessment - 05/17/22 0001       Dix-Hallpike Right   Dix-Hallpike Right Duration 0      Dix-Hallpike Left   Dix-Hallpike Left Duration 0      Horizontal Canal Right   Horizontal Canal Right Duration 0      Horizontal Canal Left   Horizontal Canal Left Duration 0                      [facility] Adult PT Treatment/Exercise - 05/17/22 0001       Lumbar Exercises: Standing   Heel Raises 10 reps    Heel Raises Limitations 3 sec    Other Standing Lumbar Exercises 3x20 sec SLS L & R    Other Standing Lumbar  Exercises cross leg back x10                 Balance Exercises - 05/17/22 0001       Balance Exercises: Standing   Standing Eyes Opened Narrow base of support (BOS);Head turns;Foam/compliant surface;30 secs   2x10 head turns & then head nods   Standing Eyes Closed 30 secs;Narrow base of support (BOS);Foam/compliant surface;3 reps    Tandem Stance Eyes open;2 reps;30 secs;Foam/compliant surface                  PT Short Term Goals - 03/25/22 1359       PT SHORT TERM GOAL #1   Title The patient will be indep with HEP for L shoulder ROM, strength, as well as large amplitude movements for balance/ parkinson's.    Baseline Has not been consistent 03/25/22    Time 4    Period Weeks    Status On-going    Target Date 03/17/22      PT SHORT TERM GOAL #2   Title The patient will be further assessed for vertigo as indicated and LTG to follow, if needed.    Time 4    Period Weeks    Status Achieved    Target Date 03/17/22      PT SHORT TERM GOAL #3   Title The patient will reduce L shoulder pain to < or equal to 3/10.    Baseline 5/10 on 03/25/22    Time 4    Period Weeks    Status Partially Met    Target Date 03/17/22      PT SHORT TERM GOAL #4   Title The patient will improve L shoulder AROM flexion to 140 degrees with pain < or equal to 2/10.    Baseline Full range pain free in supine on 03/25/22    Time 4    Period Weeks    Status Achieved    Target Date 03/17/22               PT Long Term Goals - 04/20/22 1448       PT LONG TERM GOAL #1   Title The patient will be indep with HEP progression.  Time 6    Period Weeks    Status On-going    Target Date 06/01/22      PT LONG TERM GOAL #2   Title The patient will improve mini BEST test from 20/28 up to 24/28 to demo improving balance.    Baseline 20/28 on 5/24    Time 6    Period Weeks    Status Revised    Target Date 06/01/22      PT LONG TERM GOAL #3   Title The patient will be able to lift  3 lb object to overhead shelf with L UE to demo improved L shoulder strength.    Baseline Pt to get surgery    Time 8    Period Weeks    Status Deferred    Target Date 04/16/22      PT LONG TERM GOAL #4   Title The patient will be able to maintain walking program at home    Time 6    Period Weeks    Status Revised    Target Date 06/01/22                   Plan - 05/17/22 1059     Clinical Impression Statement No s/s of BPPV this session. Continued to work on balance and stability. Difficulty with dynamic balance. Improving MiniBESTest to 25 from 22    Personal Factors and Comorbidities Comorbidity 3+    Comorbidities h/o arthroscopic shoulder procedures, h/o Parkinson's, diabetes    Examination-Activity Limitations Lift;Reach Overhead;Locomotion Level;Squat;Stairs;Stand    Examination-Participation Restrictions Community Activity    Stability/Clinical Decision Making Stable/Uncomplicated    Rehab Potential Good    PT Frequency 1x / week    PT Duration 6 weeks    PT Treatment/Interventions ADLs/Self Care Home Management;Canalith Repostioning;Vestibular;Patient/family education;Gait training;Stair training;Functional mobility training;Therapeutic activities;Therapeutic exercise;Balance training;Neuromuscular re-education;Manual techniques;Dry needling;Taping;Moist Heat;Iontophoresis 37m/ml Dexamethasone;Electrical Stimulation    PT Next Visit Plan Assess for BPPV and treat accordingly. Progress vestibular & balancee exercises. Add PWR! large amplitude standing twist, standing weight shift, and step.    PT Home Exercise Plan Access Code [email or web address]    Consulted and Agree with Plan of Care Patient             Patient will benefit from skilled therapeutic intervention in order to improve the following deficits and impairments:  Abnormal gait, Decreased activity tolerance, Decreased balance, Decreased mobility, Decreased strength, Postural dysfunction, Decreased range of motion,  Increased fascial restricitons, Dizziness  Visit Diagnosis: Muscle weakness (generalized)  Other abnormalities of gait and mobility  Unsteadiness on feet  Dizziness and giddiness     Problem List Patient Active Problem List   Diagnosis Date Noted   Chronic left shoulder pain 02/14/2022   Gait instability 02/09/2022   Parkinsonism ([place] 02/09/2022   Nonrheumatic aortic valve insufficiency 02/09/2022   Type 2 diabetes mellitus without complication, without long-term current use of insulin ([place] 02/09/2022   Pure hypercholesterolemia 02/09/2022   Depression with anxiety 02/09/2022    [facility]-WildomarApril Ma L Jubilee Vivero, PT, DPT 05/17/2022, 11:01 AM  [facility] [address]Phone: [phone]  Fax:  [phone] Name: MKellen DutchMRN: [id number] of Birth: 801-31-1947

## 2022-05-24 ENCOUNTER — Ambulatory Visit: Payer: Medicare Other | Admitting: Physical Therapy

## 2022-05-24 DIAGNOSIS — M6281 Muscle weakness (generalized): Secondary | ICD-10-CM

## 2022-05-24 DIAGNOSIS — R42 Dizziness and giddiness: Secondary | ICD-10-CM

## 2022-05-24 DIAGNOSIS — R2681 Unsteadiness on feet: Secondary | ICD-10-CM

## 2022-05-24 DIAGNOSIS — R2689 Other abnormalities of gait and mobility: Secondary | ICD-10-CM

## 2022-06-01 ENCOUNTER — Ambulatory Visit: Payer: Medicare Other | Attending: Sports Medicine | Admitting: Physical Therapy

## 2022-06-01 ENCOUNTER — Ambulatory Visit: Payer: Medicare Other | Admitting: Family Medicine

## 2022-06-01 DIAGNOSIS — M25512 Pain in left shoulder: Secondary | ICD-10-CM | POA: Insufficient documentation

## 2022-06-01 DIAGNOSIS — R2689 Other abnormalities of gait and mobility: Secondary | ICD-10-CM | POA: Diagnosis present

## 2022-06-01 DIAGNOSIS — M6281 Muscle weakness (generalized): Secondary | ICD-10-CM | POA: Insufficient documentation

## 2022-06-01 DIAGNOSIS — R2681 Unsteadiness on feet: Secondary | ICD-10-CM | POA: Diagnosis present

## 2022-06-01 DIAGNOSIS — R42 Dizziness and giddiness: Secondary | ICD-10-CM | POA: Insufficient documentation

## 2022-06-01 NOTE — Therapy (Signed)
[facility] [place], [place], [address] Phone: [phone]   Fax:  [phone]  Physical Therapy Treatment and Discharge  Patient Details  Name: Ralph Jensen MRN: [id number] Date of Birth: [birth year] Referring Provider (PT): Dr. Dianah Field (shoulder), Dr. Zigmund Daniel (gait)  Rationale for Evaluation and Treatment Rehabilitation  PHYSICAL THERAPY DISCHARGE SUMMARY  Visits from Start of Care: 17  Current functional level related to goals / functional outcomes: See below   Remaining deficits: See below   Education / Equipment: See below   Patient agrees to discharge. Patient goals were partially met. Patient is being discharged due to meeting the stated rehab goals.  Encounter Date: 06/01/2022   PT End of Session - 06/01/22 1025     Visit Number 17    Number of Visits 16    Date for PT Re-Evaluation 06/01/22    Authorization Type medicare    Authorization - Visit Number 17    Progress Note Due on Visit 20    PT Start Time 1020    PT Stop Time 1055    PT Time Calculation (min) 35 min    Activity Tolerance Patient tolerated treatment well    Behavior During Therapy WFL for tasks assessed/performed             Past Medical History:  Diagnosis Date   Aortic valve stenosis    Cognitive change    Diabetic acidosis, type I (HCC)    Heart murmur     Past Surgical History:  Procedure Laterality Date   APPENDECTOMY     KIDNEY STONE SURGERY     SHOULDER ARTHROSCOPY WITH ROTATOR CUFF REPAIR Left     There were no vitals filed for this visit.   Subjective Assessment - 06/01/22 1028     Subjective Pt states his son came to visit. Pt also notes he stubbed his L toe -- it bled a lot and it is currently reinforced with dressings.    Pertinent History Parkinson's Disease, diabetes.    Diagnostic tests Xray ordered yesterday    Patient Stated Goals strengthen L shoulder, improve balance     Currently in Pain? No/denies                [facility] PT Assessment - 06/01/22 0001       Assessment   Medical Diagnosis gait instability and chronic L shoulder pain    Referring Provider (PT) Dr. Dianah Field (shoulder), Dr. Zigmund Daniel (gait)      Mini-BESTest   Sit To Stand Normal: Comes to stand without use of hands and stabilizes independently.    Rise to Toes Normal: Stable for 3 s with maximum height.    Stand on one leg (left) Moderate: < 20 s    Stand on one leg (right) Moderate: < 20 s    Stand on one leg - lowest score 1    Compensatory Stepping Correction - Forward Normal: Recovers independently with a single, large step (second realignement is allowed).    Compensatory Stepping Correction - Backward Normal: Recovers independently with a single, large step    Compensatory Stepping Correction - Left Lateral Normal: Recovers independently with 1 step (crossover or lateral OK)    Compensatory Stepping Correction - Right Lateral Normal: Recovers independently with 1 step (crossover or lateral OK)    Stepping Corredtion Lateral - lowest score 2    Stance - Feet together, eyes open, firm surface  Normal: 30s    Stance - Feet  together, eyes closed, foam surface  Normal: 30s   after 2 tries   Incline - Eyes Closed Normal: Stands independently 30s and aligns with gravity    Change in Gait Speed Normal: Significantly changes walkling speed without imbalance    Walk with head turns - Horizontal Normal: performs head turns with no change in gait speed and good balance    Walk with pivot turns Normal: Turns with feet close FAST (< 3 steps) with good balance.    Step over obstacles Normal: Able to step over box with minimal change of gait speed and with good balance.    Timed UP & GO with Dual Task Moderate: Dual Task affects either counting OR walking (>10%) when compared to the TUG without Dual Task.    Mini-BEST total score 26      Timed Up and Go Test   Normal TUG (seconds) 7     Cognitive TUG (seconds) 9   wrong calculations                          [facility] Adult PT Treatment/Exercise - 06/01/22 0001       Lumbar Exercises: Aerobic   Nustep L5 x 5 min UEs/LEs                       PT Short Term Goals - 03/25/22 1359       PT SHORT TERM GOAL #1   Title The patient will be indep with HEP for L shoulder ROM, strength, as well as large amplitude movements for balance/ parkinson's.    Baseline Has not been consistent 03/25/22    Time 4    Period Weeks    Status On-going    Target Date 03/17/22      PT SHORT TERM GOAL #2   Title The patient will be further assessed for vertigo as indicated and LTG to follow, if needed.    Time 4    Period Weeks    Status Achieved    Target Date 03/17/22      PT SHORT TERM GOAL #3   Title The patient will reduce L shoulder pain to < or equal to 3/10.    Baseline 5/10 on 03/25/22    Time 4    Period Weeks    Status Partially Met    Target Date 03/17/22      PT SHORT TERM GOAL #4   Title The patient will improve L shoulder AROM flexion to 140 degrees with pain < or equal to 2/10.    Baseline Full range pain free in supine on 03/25/22    Time 4    Period Weeks    Status Achieved    Target Date 03/17/22               PT Long Term Goals - 06/01/22 1026       PT LONG TERM GOAL #1   Title The patient will be indep with HEP progression.    Baseline Not consistent    Time 6    Period Weeks    Status Partially Met    Target Date 06/01/22      PT LONG TERM GOAL #2   Title The patient will improve mini BEST test from 20/28 up to 24/28 to demo improving balance.    Baseline 26/28 on 06/01/22    Time 6    Period Weeks    Status Achieved  Target Date 06/01/22      PT LONG TERM GOAL #3   Title The patient will be able to lift 3 lb object to overhead shelf with L UE to demo improved L shoulder strength.    Baseline Pt to get surgery    Time 8    Period Weeks    Status Deferred     Target Date 04/16/22      PT LONG TERM GOAL #4   Title The patient will be able to maintain walking program at home    Time 6    Period Weeks    Status Achieved    Target Date 06/01/22                   Plan - 06/01/22 1056     Clinical Impression Statement Re-checked pt's goals this session. Pt feels ready for PT d/c for his balance at this time. Feels he has adequate HEP to continue to work on his deficits at home. Reinforced practicing his backwards weight shifting, single leg stance, tandem stance, and balance on foam. Limited this session due to L toe pain after stubbing it. Pt has met or partially met his balance goals. Shoulder goal deferred as pt has opted for surgical intervention once neuro clears him.    Personal Factors and Comorbidities Comorbidity 3+    Comorbidities h/o arthroscopic shoulder procedures, h/o Parkinson's, diabetes    Examination-Activity Limitations Lift;Reach Overhead;Locomotion Level;Squat;Stairs;Stand    Examination-Participation Restrictions Community Activity    Stability/Clinical Decision Making Stable/Uncomplicated    Rehab Potential Good    PT Frequency 1x / week    PT Duration 6 weeks    PT Treatment/Interventions ADLs/Self Care Home Management;Canalith Repostioning;Vestibular;Patient/family education;Gait training;Stair training;Functional mobility training;Therapeutic activities;Therapeutic exercise;Balance training;Neuromuscular re-education;Manual techniques;Dry needling;Taping;Moist Heat;Iontophoresis 36m/ml Dexamethasone;Electrical Stimulation    PT Next Visit Plan Assess for BPPV and treat accordingly. Progress vestibular & balance exercises. Work on dynamic balance, standing weight shift, and step.    PT Home Exercise Plan Access Code [email or web address]    Consulted and Agree with Plan of Care Patient             Patient will benefit from skilled therapeutic intervention in order to improve the following deficits and impairments:   Abnormal gait, Decreased activity tolerance, Decreased balance, Decreased mobility, Decreased strength, Postural dysfunction, Decreased range of motion, Increased fascial restricitons, Dizziness  Visit Diagnosis: Muscle weakness (generalized)  Other abnormalities of gait and mobility  Unsteadiness on feet  Dizziness and giddiness  Acute pain of left shoulder     Problem List Patient Active Problem List   Diagnosis Date Noted   Chronic left shoulder pain 02/14/2022   Gait instability 02/09/2022   Parkinsonism ([place] 02/09/2022   Nonrheumatic aortic valve insufficiency 02/09/2022   Type 2 diabetes mellitus without complication, without long-term current use of insulin ([place] 02/09/2022   Pure hypercholesterolemia 02/09/2022   Depression with anxiety 02/09/2022    GMadison HospitalApril MGordy Levan PT, DPT 06/01/2022, 12:52 PM  [facility] [address]Phone: [phone]  Fax:  [phone] Name: Ralph GuertinMRN: [id number] of Birth: 813-Feb-1947

## 2022-06-15 LAB — MICROALBUMIN / CREATININE URINE RATIO: Microalb Creat Ratio: 9.6

## 2022-06-15 LAB — PROTEIN / CREATININE RATIO, URINE
Albumin, U: 6.7
Creatinine, Urine: 69

## 2022-06-20 ENCOUNTER — Emergency Department
Admission: EM | Admit: 2022-06-20 | Discharge: 2022-06-20 | Disposition: A | Discharge status: Home / Self Care | Payer: Medicare Other | Attending: Family Medicine | Admitting: Family Medicine

## 2022-06-20 ENCOUNTER — Ambulatory Visit (INDEPENDENT_AMBULATORY_CARE_PROVIDER_SITE_OTHER): Payer: Medicare Other

## 2022-06-20 ENCOUNTER — Emergency Department (INDEPENDENT_AMBULATORY_CARE_PROVIDER_SITE_OTHER): Payer: Medicare Other

## 2022-06-20 ENCOUNTER — Ambulatory Visit (INDEPENDENT_AMBULATORY_CARE_PROVIDER_SITE_OTHER): Payer: Medicare Other | Admitting: Family Medicine

## 2022-06-20 ENCOUNTER — Ambulatory Visit: Payer: Medicare Other | Admitting: Family Medicine

## 2022-06-20 VITALS — BP 112/57 | HR 85 | Wt 173.0 lb

## 2022-06-20 DIAGNOSIS — R0600 Dyspnea, unspecified: Secondary | ICD-10-CM

## 2022-06-20 DIAGNOSIS — R0602 Shortness of breath: Secondary | ICD-10-CM

## 2022-06-20 DIAGNOSIS — R0781 Pleurodynia: Secondary | ICD-10-CM | POA: Diagnosis not present

## 2022-06-20 DIAGNOSIS — R071 Chest pain on breathing: Secondary | ICD-10-CM

## 2022-06-20 DIAGNOSIS — R051 Acute cough: Secondary | ICD-10-CM | POA: Diagnosis not present

## 2022-06-20 DIAGNOSIS — Z86718 Personal history of other venous thrombosis and embolism: Secondary | ICD-10-CM

## 2022-06-20 LAB — I-STAT CREATININE (MANUAL ENTRY): Creatinine, Ser: 1.3 — AB (ref 0.50–1.10)

## 2022-06-20 MED ORDER — IOHEXOL 350 MG/ML SOLN
100.0000 mL | Freq: Once | INTRAVENOUS | Status: AC | PRN
  Administered 2022-06-20: 100 mL via INTRAVENOUS

## 2022-06-20 NOTE — ED Provider Notes (Signed)
[facility]    CSN: [id number] Arrival date & time: 06/20/22  0947      History   Chief Complaint Chief Complaint  Patient presents with   Chest Pain    RIB    HPI Hancel Ion is a 76 y.o. male.   HPI Very pleasant 76 year old male presents with left-sided rib pain for 2 days.  Patient denies injury or insult to this area.  PMH significant for parkinsonian is him/cognitive change, aortic valve stenosis, non-rheumatic aortic valve insufficiency, and gait instability.  Patient is accompanied by his wife today; reports possibility of patient falling at home and her not knowing about it given gait instability and cognitive changes.  Patient's wife reports will be following up with PCP today at 2:30 PM.  Past Medical History:  Diagnosis Date   Aortic valve stenosis    Cognitive change    Diabetic acidosis, type I (HCC)    Heart murmur     Patient Active Problem List   Diagnosis Date Noted   Chronic left shoulder pain 02/14/2022   Gait instability 02/09/2022   Parkinsonism (HCC) 02/09/2022   Nonrheumatic aortic valve insufficiency 02/09/2022   Type 2 diabetes mellitus without complication, without long-term current use of insulin (HCC) 02/09/2022   Pure hypercholesterolemia 02/09/2022   Depression with anxiety 02/09/2022    Past Surgical History:  Procedure Laterality Date   APPENDECTOMY     KIDNEY STONE SURGERY     SHOULDER ARTHROSCOPY WITH ROTATOR CUFF REPAIR Left        Home Medications    Prior to Admission medications   Medication Sig Start Date End Date Taking? Authorizing Provider  atorvastatin (LIPITOR) 10 MG tablet Take 1 tablet (10 mg total) by mouth daily. 02/09/22   Everrett Coombe, DO  buPROPion (WELLBUTRIN XL) 150 MG 24 hr tablet Take 1 tablet (150 mg total) by mouth daily. Take in the evening in addition to 300mg  to make total of 450mg /day 05/04/22   , DO  buPROPion (WELLBUTRIN XL) 300 MG 24 hr tablet Take 1 tablet (300  mg total) by mouth daily. 02/09/22   Everrett Coombe, DO  carbidopa-levodopa (SINEMET IR) 25-100 MG tablet Take 1 tablet by mouth 4 (four) times daily. 02/09/22 05/10/22  02/11/22, DO  dapagliflozin propanediol (FARXIGA) 5 MG TABS tablet Take 1 tablet (5 mg total) by mouth daily before breakfast. Patient taking differently: Take 10 mg by mouth daily before breakfast. 02/09/22   Everrett Coombe, DO  donepezil (ARICEPT) 10 MG tablet Take 1 tablet (10 mg total) by mouth daily. 02/09/22   Everrett Coombe, DO  EPINEPHrine 0.3 mg/0.3 mL IJ SOAJ injection Inject 0.3 mg into the muscle as needed for anaphylaxis. 02/09/22   Everrett Coombe, DO    Family History Family History  Problem Relation Age of Onset   Heart attack Mother     Social History Social History   Tobacco Use   Smoking status: Never    Passive exposure: Never   Smokeless tobacco: Never  Vaping Use   Vaping Use: Never used  Substance Use Topics   Alcohol use: Never   Drug use: Never     Allergies   Bee venom, Fluoxetine, and Metformin and related   Review of Systems Review of Systems  Musculoskeletal:        Left side pain     Physical Exam Triage Vital Signs ED Triage Vitals  Enc Vitals Group     BP  Pulse      Resp      Temp      Temp src      SpO2      Weight      Height      Head Circumference      Peak Flow      Pain Score      Pain Loc      Pain Edu?      Excl. in GC?    No data found.  Updated Vital Signs BP 113/71 (BP Location: Right Arm)   Pulse 71   Temp (!) 97.4 F (36.3 C) (Oral)   Resp 16   SpO2 96%       Physical Exam Vitals and nursing note reviewed.  Constitutional:      General: He is not in acute distress.    Appearance: Normal appearance. He is normal weight. He is not ill-appearing.  HENT:     Head: Normocephalic and atraumatic.     Mouth/Throat:     Mouth: Mucous membranes are moist.     Pharynx: Oropharynx is clear.  Eyes:     Extraocular Movements:  Extraocular movements intact.     Conjunctiva/sclera: Conjunctivae normal.     Pupils: Pupils are equal, round, and reactive to light.  Neck:     Comments: No JVD, no bruit Cardiovascular:     Rate and Rhythm: Normal rate and regular rhythm.     Pulses: Normal pulses.     Heart sounds: Murmur heard.     Comments: 2/6 HSEM heard best at left sternal border Pulmonary:     Effort: Pulmonary effort is normal.     Breath sounds: No wheezing, rhonchi or rales.     Comments: Moving air throughout well, mildly diminished breath sounds bibasilarly; mildly TTP of her left-sided sixth-ninth ribs (posterior lateral aspects) Abdominal:     General: Bowel sounds are normal.  Musculoskeletal:        General: Normal range of motion.     Cervical back: Normal range of motion and neck supple. No tenderness.  Lymphadenopathy:     Cervical: No cervical adenopathy.  Skin:    General: Skin is warm and dry.  Neurological:     General: No focal deficit present.     Mental Status: He is alert and oriented to person, place, and time. Mental status is at baseline.     Cranial Nerves: No cranial nerve deficit.     Sensory: No sensory deficit.     Motor: No weakness.      UC Treatments / Results  Labs (all labs ordered are listed, but only abnormal results are displayed) Labs Reviewed - No data to display  EKG   Radiology DG Chest 2 View  Result Date: 06/20/2022 CLINICAL DATA:  Left-sided rib pain, dyspnea EXAM: CHEST - 2 VIEW COMPARISON:  None Available. FINDINGS: The heart size and mediastinal contours are within normal limits. Elevated right hemidiaphragm with right mid and lower lung atelectasis. The lungs are otherwise clear. No pleural effusion or pneumothorax. Aortic calcification. Postoperative changes left shoulder. IMPRESSION: Elevated right hemidiaphragm and associated atelectasis. No acute abnormality. Electronically Signed   By: Minerva Fester M.D.   On: 06/20/2022 10:28     Procedures Procedures (including critical care time)  Medications Ordered in UC Medications - No data to display  Initial Impression / Assessment and Plan / UC Course  I have reviewed the triage vital signs and the nursing notes.  Pertinent labs & imaging results that were available during my care of the patient were reviewed by me and considered in my medical decision making (see chart for details).     MDM: 1.  Rib pain on left side-CXR revealed above; 2.  Dyspnea, unspecified type-Advised patient/spouse of chest x-ray results today with hard copy provided to patient and spouse.  Advised patient to follow-up with PCP for further evaluation and possible CT of chest with contrast for further evaluation of mild dyspnea at rest.  Patient is scheduled to follow-up PCP today at 230 PM.  Patient discharged home, hemodynamically stable.    Final Clinical Impressions(s) / UC Diagnoses   Final diagnoses:  Rib pain on left side  Dyspnea, unspecified type     Discharge Instructions      Advised patient/spouse of chest x-ray results today with hard copy provided to patient and spouse.  Advised patient to follow-up with PCP for further evaluation and possible CT of chest with contrast for further evaluation of mild dyspnea at rest and left-sided.      ED Prescriptions   None    PDMP not reviewed this encounter.   Calum, Cormier, FNP 06/20/22 1255

## 2022-06-20 NOTE — Progress Notes (Signed)
Pt's and his wife report that his breathing has been more "shallow" and it has been difficult for him to breath.   Pt was seen in the ED today for L sided rib pain and Dyspnea and told to f/u with pcp for further evaluation and possible CT of chest w/contrast 

## 2022-06-20 NOTE — Progress Notes (Signed)
   Acute Office Visit  Subjective:     Patient ID: Ralph Jensen, male    DOB: 1945-12-26, 76 y.o.   MRN: [id number]  Chief Complaint  Patient presents with   Follow-up    HPI Patient is in today for 2 days of left-sided lower anterior rib pain.  He says that he has noticed that it is painful with taking a deep breath,coughing or laughing.  He also reports though that he has had a slight cough.  But no other upper respiratory symptoms.  In fact he went to the urgent care today and they did a chest x-ray which was normal.  Just noted some aortic calcification but nothing else unusual.  ROS      Objective:    BP (!) 112/57   Pulse 85   Wt 173 lb (78.5 kg)   SpO2 95%   BMI 27.92 kg/m    Physical Exam Constitutional:      Appearance: He is well-developed.  HENT:     Head: Normocephalic and atraumatic.  Cardiovascular:     Rate and Rhythm: Normal rate and regular rhythm.     Heart sounds: Normal heart sounds.  Pulmonary:     Effort: Pulmonary effort is normal.     Comments: Crackles of the left lower lung base. Chest:       Comments: 2 areas of tenderness on the left anterior and lateral rib area.   Skin:    General: Skin is warm and dry.  Neurological:     Mental Status: He is alert and oriented to person, place, and time.  Psychiatric:        Behavior: Behavior normal.     No results found for any visits on 06/20/22.      Assessment & Plan:   Problem List Items Addressed This Visit   None Visit Diagnoses     Chest pain on breathing    -  Primary   Relevant Orders   CT Angio Chest W/Cm &/Or Wo Cm   SOB (shortness of breath)       Relevant Orders   CT Angio Chest W/Cm &/Or Wo Cm   Acute cough       Relevant Orders   CT Angio Chest W/Cm &/Or Wo Cm   History of DVT of lower extremity       Relevant Orders   CT Angio Chest W/Cm &/Or Wo Cm       Left anterior lower rib pain-he is actually tender in 2 areas but I am concerned that without  trauma or injury that a rib fracture would be a little bit unusual and I am hearing crackles in that posterior lower lung area.  Chest x-ray was normal which is reassuring but I think we need to move forward with CT.  With having had a DVT about 2 years ago he is at increased risk for having another blood clot.  It sounds like the first 1 was after having had some procedure or surgery.  We will get CTA.  They will be able to do an i-STAT for renal function.  No orders of the defined types were placed in this encounter.   No follow-ups on file.  Nani Gasser, MD

## 2022-06-20 NOTE — Discharge Instructions (Addendum)
Advised patient/spouse of chest x-ray results today with hard copy provided to patient and spouse.  Advised patient to follow-up with PCP for further evaluation and possible CT of chest with contrast for further evaluation of mild dyspnea at rest and left-sided.

## 2022-06-20 NOTE — Progress Notes (Signed)
Called patient with results.  No worrisome findings.  Lung nodules found which do need follow-up in 3 to 6 months.  But did reassure him that this is not causing his pain and discomfort.  Recommend he use Tylenol and heat for comfort.

## 2022-06-20 NOTE — Progress Notes (Signed)
Pt's and his wife report that his breathing has been more "shallow" and it has been difficult for him to breath.   Pt was seen in the ED today for L sided rib pain and Dyspnea and told to f/u with pcp for further evaluation and possible CT of chest w/contrast

## 2022-06-20 NOTE — ED Triage Notes (Signed)
Pt presents with left sided rib pain that began Saturday. NKI.

## 2022-06-21 ENCOUNTER — Telehealth: Payer: Self-pay

## 2022-06-21 NOTE — Telephone Encounter (Signed)
Left msg for pt asking about pts status since UC visit yesterday. Call if any questions.

## 2022-07-18 NOTE — Progress Notes (Unsigned)
Initial neurology clinic note  SERVICE DATE: 07/20/22 SERVICE TIME: 2:30 pm  Reason for Evaluation: Consultation requested by Tat, Ralph Quail, DO for an opinion regarding ptosis and falls. My final recommendations will be communicated back to the requesting physician by way of shared medical record or letter to requesting physician via [place] mail.  HPI: This is [name], a 76 y.o. 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 The patient is accompanied by his wife.  Patient was previously seen in this office by Dr. Carles Collet (first on 02/25/22 then 04/11/22) for evaluation of parkinsonism. At that evaluation, patient endorsed double vision and right eye drooping when patient is tired or in the evening. Due to these concerns, myasthenia gravis was considered. AChR abs were sent and negative (binding, blocking, and modulating - 02/25/22). Subsequently, MuSK ab were also negative (04/11/22).  Wife helps with history today. She has noticed his eyes are drooping for about the past 5 years at least. It is worse when he is tired. Wife will tell him to open his eyes, and he will, but then they will droop again. It has an irregular pattern per wife. Patient does not notice the drooping. He endorses occasional double vision but this is associated with position changes. He does not have double vision when reading the paper, even for long periods of time. He has occasional slurred speech. Per wife, this occurs during episodes when he is "doppy" with his memory problems. He denies any difficulties with chewing or swallowing. He denies choking. He denies neuromuscular respiratory weakness (orthopnea > dyspnea). He denies arm weakness, but does have left shoulder problem. He has diabetic neuropathy but denies significant weakness in his legs. He has fallen in the past so  he walks with a walker or cane because his legs get tired. He uses the walker to sit after walking because he feels like his legs are getting weak. He falls about every other week. He does endorse low back pain. He denies muscle pain.  He was previously in PT, but does not do the exercises at home. He is not as active as he was due to not having a good place to walk.  Per wife, he has an oddly strong sense of smell. He sometimes smells things that his wife does not smell (stinking). He hearing is also very sensitive. He denies changes in taste sensation.  He report any constitutional symptoms like fever, night sweats, anorexia or unintentional weight loss.  EtOH use: Once per month, 1 beer  Restrictive diet? No Family history of neuropathy/myopathy/NM disease? Not that he knows  He did have an EMG years ago that showed diabetic neuropathy.  He wakes up in the morning and is very tired. Wife has wanted to get him a sleep study, but patient will not do it.   MEDICATIONS:  Outpatient Encounter Medications as of 07/20/2022  Medication Sig   acetaminophen (TYLENOL) 500 MG tablet Take 500 mg by mouth every 6 (six) hours as needed.   atorvastatin (LIPITOR) 10 MG tablet Take 1 tablet (10 mg total) by mouth daily.   buPROPion (WELLBUTRIN XL) 150 MG 24 hr tablet Take 1 tablet (150 mg total) by mouth daily. Take in the evening in addition to 314m to make total of 4568mday   buPROPion (WELLBUTRIN XL) 300 MG 24 hr tablet Take 1 tablet (300 mg total) by mouth  daily.   dapagliflozin propanediol (FARXIGA) 5 MG TABS tablet Take 1 tablet (5 mg total) by mouth daily before breakfast. (Patient taking differently: Take 10 mg by mouth daily before breakfast.)   donepezil (ARICEPT) 10 MG tablet Take 1 tablet (10 mg total) by mouth daily.   EPINEPHrine 0.3 mg/0.3 mL IJ SOAJ injection Inject 0.3 mg into the muscle as needed for anaphylaxis.   No facility-administered encounter medications on file as of  07/20/2022.    PAST MEDICAL HISTORY: Past Medical History:  Diagnosis Date   Aortic valve stenosis    Cognitive change    Diabetic acidosis, type I (HCC)    Heart murmur     PAST SURGICAL HISTORY: Past Surgical History:  Procedure Laterality Date   APPENDECTOMY     KIDNEY STONE SURGERY     SHOULDER ARTHROSCOPY WITH ROTATOR CUFF REPAIR Left     ALLERGIES: Allergies  Allergen Reactions   Bee Venom    Fluoxetine    Metformin And Related     FAMILY HISTORY: Family History  Problem Relation Age of Onset   Heart attack Mother    Heart disease Sister    COPD Sister     SOCIAL HISTORY: Social History   Tobacco Use   Smoking status: Never    Passive exposure: Never   Smokeless tobacco: Never  Vaping Use   Vaping Use: Never used  Substance Use Topics   Alcohol use: Never    Comment: rarely   Drug use: Never   Social History   Social History Narrative   Right handed   Retired   Lives with wife one story home   Caffeine none     OBJECTIVE: PHYSICAL EXAM: BP 109/70   Pulse 73   Ht 5' 6" (1.676 m)   Wt 173 lb (78.5 kg)   SpO2 97%   BMI 27.92 kg/m   General: General appearance: Awake and alert. No distress. Cooperative with exam.  Skin: No obvious rash or jaundice. HEENT: Atraumatic. Anicteric. Lungs: Non-labored breathing on room air  Extremities: No edema. No obvious deformity.  Musculoskeletal: No obvious joint swelling. Psych: Affect appropriate.  Neurological: Mental Status: Alert. Speech fluent. No pseudobulbar affect Cranial Nerves: CNII: No RAPD. Visual fields intact. CNIII, IV, VI: PERRL. No nystagmus. EOMI. No diplopia with sustained upgaze. CN V: Facial sensation intact bilaterally to fine touch. Masseter clench strong. Jaw jerk negative. CN VII: Facial muscles symmetric and strong. No ptosis at rest or after sustained upgaze. CN VIII: Hears finger rub well bilaterally. CN IX: No hypophonia. CN X: Palate elevates symmetrically. CN  XI: Full strength shoulder shrug bilaterally. CN XII: Tongue protrusion full and midline. No atrophy or fasciculations. No significant dysarthria Motor: Tone is normal in LUE and LLE. Increased tone in RUE and RLE No fasciculations in extremities. No atrophy.  Individual muscle group testing (MRC grade out of 5):  Movement     Neck flexion 5    Neck extension 5     Right Left   Shoulder abduction 5 5 No fatigable weakness  Shoulder adduction 5 5   Shoulder ext rotation 5 5   Shoulder int rotation 5 5   Elbow flexion 5 5   Elbow extension 5 5   Wrist extension 5 5   Wrist flexion 5 5   Finger abduction - FDI 5 5   Finger abduction - ADM 5 5   Finger extension 5 5   Finger distal flexion - 2/3 5 5  Finger distal flexion - 4/_0 Thumb flexion - FPL 5 5   Thumb abduction - APB 4+ 4+    Hip flexion 5 5 No fatigable weakness  Hip extension 5 5   Hip adduction 5 5   Hip abduction 5 5   Knee extension 5 5   Knee flexion 5 5   Dorsiflexion 5 5   Plantarflexion 5 5   Inversion 5 5   Eversion 5 5   Great toe extension 5 5   Great toe flexion 5 5     Reflexes:  Right Left   Bicep 2+ 2+   Tricep 2+ 2+   BrRad 2+ 2+   Knee 3+ 2+   Ankle 0 0    Pathological Reflexes: Babinski: Mute response on right and flexor on left bilaterally Hoffman: Present bilaterally Troemner: Absent bilaterally Palmomental: Absent Facial: Absent Midline tap: Absent Sensation: Pinprick: Intact in all extremities Vibration: intact in all extremities Temperature: intact in all extremities Proprioception: Diminished in bilateral great toes Coordination: Intact finger-to- nose-finger bilaterally. Romberg with mild sway Gait: Able to rise from chair with arms crossed unassisted. Wide-based gait. Able to tandem walk. Able to walk on toes and heels.  Lab and Test Review: Internal labs: Component     Latest Ref Rng 02/09/2022 02/25/2022  WBC     3.8 - 10.8 Thousand/uL 7.2    RBC     4.20 -  5.80 Million/uL 5.47    Hemoglobin     13.2 - 17.1 g/dL 17.0    HCT     38.5 - 50.0 % 50.9 (H)    MCV     80.0 - 100.0 fL 93.1    MCH     27.0 - 33.0 pg 31.1    MCHC     32.0 - 36.0 g/dL 33.4    RDW     11.0 - 15.0 % 12.7    Platelets     140 - 400 Thousand/uL 254    MPV     7.5 - 12.5 fL 9.8    NEUT#     1,500 - 7,800 cells/uL 4,428    Lymphocyte #     850 - 3,900 cells/uL 1,627    Absolute Monocytes     200 - 950 cells/uL 936    Eosinophils Absolute     15 - 500 cells/uL 151    Basophils Absolute     0 - 200 cells/uL 58    Neutrophils     % 61.5    Total Lymphocyte     % 22.6    Monocytes Relative     % 13.0    Eosinophil     % 2.1    Basophil     % 0.8    Glucose     65 - 139 mg/dL 124    BUN     7 - 25 mg/dL 27 (H)    Creatinine     0.70 - 1.28 mg/dL 1.10    eGFR     > OR = 60 mL/min/1.37m 70    BUN/Creatinine Ratio     6 - 22 (calc) 25 (H)    Sodium     135 - 146 mmol/L 140    Potassium     3.5 - 5.3 mmol/L 4.2    Chloride     98 - 110 mmol/L 103    CO2     20 - 32 mmol/L 24  Calcium     8.6 - 10.3 mg/dL 10.0    Total Protein     6.1 - 8.1 g/dL 7.4    Albumin MSPROF     3.6 - 5.1 g/dL 4.5    Globulin     1.9 - 3.7 g/dL (calc) 2.9    AG Ratio     1.0 - 2.5 (calc) 1.6    Total Bilirubin     0.2 - 1.2 mg/dL 1.4 (H)    Alkaline phosphatase (APISO)     35 - 144 U/L 86    AST     10 - 35 U/L 13    ALT     9 - 46 U/L 9    Interpretation    Technical Results    Comments    Methods    References:    Hemoglobin A1C     <5.7 % of total Hgb 6.5 (H)    Mean Plasma Glucose     mg/dL 140    eAG (mmol/L)     mmol/L 7.7    A CHR BINDING ABS     nmol/L  <0.30   ACHR Blocking Abs     <15 % Inhibition  <15   Acetylchol Modul Ab     % Inhibition  <1     Legend: (H) High  MRI brain wo contrast (05/02/22): FINDINGS: Brain: Generalized age related volume loss. No focal abnormality seen affecting the brainstem or cerebellum. Cerebral  hemispheres show minimal/mild chronic small-vessel ischemic change of the white matter. No cortical or large vessel territory infarction. No mass lesion, hemorrhage, hydrocephalus or extra-axial collection.   Vascular: Major vessels at the base of the brain show flow.   Skull and upper cervical spine: Negative   Sinuses/Orbits: Clear/normal   Other: Question ferromagnetic foreign object in the left frontal scalp with subsequent artifact.   IMPRESSION: No acute or reversible finding. Generalized age related volume loss. Minimal/mild chronic small-vessel ischemic change of the cerebral hemispheric white matter.   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  ASSESSMENT: Meldon Hanzlik is a 76 y.o. male who presents for evaluation of ptosis and falls. He has a relevant medical history of DM, HLD, depression, and memory difficulties. His neurological examination is pertinent for ptosis, but negative ice pack test, and no fatigable weakness. He does have increased tone in RUE and RLE and reduced proprioception. Available diagnostic data is significant for negative AChR abs and MuSK abs. His MRI brain showed moderate atrophy out of proportion to age but no obvious focal lesions, particularly in the left hemisphere to explain increased tone.   Overall, based on patient's history, examination, and antibody testing, I believe the diagnosis of myasthenia gravis is less likely. The episodes of confusion and memory difficulties sound like mild cognitive impairment vs a neurodegenerative disorder such as dementia, so I wonder how much of the eyelids drooping is true neuromuscular weakness vs behavioral or another process. It is reasonable to pursue single fiber EMG to rule out myasthenia gravis though.  Regarding his falls, this may be multifactorial with contributions from increased tone in right lower extremity and his proprioceptive sensory loss,  likely secondary to diabetic neuropathy. While there is no focal brain lesion to explain the increased tone, MRI cervical spine is reasonable to ensure no cervical stenosis. I will also obtain labs to screen for other possible etiologies such as vitamin deficiency or thyroid abnormality.  PLAN: -Blood work: TSH, B12, MMA,  folate, B1 -Will cancel EMG with [facility] here on 07/25/22 -Will refer to [facility] for single fiber EMG to rule out MG -MRI cervical spine wo contrast  -Return to clinic in 3-4 months  The impression above as well as the plan as outlined below were extensively discussed with the patient (in the company of wife) who voiced understanding. All questions were answered to their satisfaction.  The patient was counseled on pertinent fall precautions per the printed material provided today, and as noted under the "Patient Instructions" section below.  When available, results of the above investigations and possible further recommendations will be communicated to the patient via telephone/MyChart. Patient to call office if not contacted after expected testing turnaround time.   Total time spent reviewing records, interview, history/exam, documentation, and coordination of care on day of encounter:  75 min   Thank you for allowing me to participate in patient's care.  If I can answer any additional questions, I would be pleased to do so.  Kai Levins, MD   CC: Luetta Nutting, DO [place] [address]  CC: Referring provider: Tat, Ralph Quail, DO [place]  [address],  [place] [address]

## 2022-07-20 ENCOUNTER — Other Ambulatory Visit (INDEPENDENT_AMBULATORY_CARE_PROVIDER_SITE_OTHER): Payer: Medicare Other

## 2022-07-20 ENCOUNTER — Encounter: Payer: Self-pay | Admitting: Neurology

## 2022-07-20 ENCOUNTER — Ambulatory Visit (INDEPENDENT_AMBULATORY_CARE_PROVIDER_SITE_OTHER): Payer: Medicare Other | Admitting: Neurology

## 2022-07-20 ENCOUNTER — Encounter: Payer: Self-pay | Admitting: General Practice

## 2022-07-20 VITALS — BP 109/70 | HR 73 | Ht 66.0 in | Wt 173.0 lb

## 2022-07-20 DIAGNOSIS — W19XXXD Unspecified fall, subsequent encounter: Secondary | ICD-10-CM | POA: Diagnosis not present

## 2022-07-20 DIAGNOSIS — E1142 Type 2 diabetes mellitus with diabetic polyneuropathy: Secondary | ICD-10-CM | POA: Diagnosis not present

## 2022-07-20 DIAGNOSIS — R269 Unspecified abnormalities of gait and mobility: Secondary | ICD-10-CM

## 2022-07-20 DIAGNOSIS — H02403 Unspecified ptosis of bilateral eyelids: Secondary | ICD-10-CM | POA: Diagnosis not present

## 2022-07-20 DIAGNOSIS — M6289 Other specified disorders of muscle: Secondary | ICD-10-CM

## 2022-07-20 NOTE — Patient Instructions (Addendum)
I saw you today for drooping eyes and falls. Specifically I was asked to look for a condition called myasthenia gravis.  After my evaluation, I do not think you have evidence of myasthenia gravis. We discussed that the definitive test for this is a single fiber EMG. This would need to be done at [facility]. We will put in this referral.  We will cancel the EMG you had scheduled with [place] on Monday.  For your falls, I would like to get some lab work today. I would also like to get an MRI of your neck (cervical spine).   Preventing Falls at [facility] are common, often dreaded events in the lives of older people. Aside from the obvious injuries and even death that may result, fall can cause wide-ranging consequences including loss of independence, mental decline, decreased activity and mobility. Younger people are also at risk of falling, especially those with chronic illnesses and fatigue.  Ways to reduce risk for falling Examine diet and medications. Warm foods and alcohol dilate blood vessels, which can lead to dizziness when standing. Sleep aids, antidepressants and pain medications can also increase the likelihood of a fall.  Get a vision exam. Poor vision, cataracts and glaucoma increase the chances of falling.  Check foot gear. Shoes should fit snugly and have a sturdy, nonskid sole and a broad, low heel  Participate in a physician-approved exercise program to build and maintain muscle strength and improve balance and coordination. Programs that use ankle weights or stretch bands are excellent for muscle-strengthening. Water aerobics programs and low-impact Tai Chi programs have also been shown to improve balance and coordination.  Increase vitamin D intake. Vitamin D improves muscle strength and increases the amount of calcium the body is able to absorb and deposit in bones.  How to prevent falls from common hazards Floors - Remove all loose wires, cords, and throw rugs. Minimize  clutter. Make sure rugs are anchored and smooth. Keep furniture in its usual place.  Chairs -- Use chairs with straight backs, armrests and firm seats. Add firm cushions to existing pieces to add height.  Bathroom - Install grab bars and non-skid tape in the tub or shower. Use a bathtub transfer bench or a shower chair with a back support Use an elevated toilet seat and/or safety rails to assist standing from a low surface. Do not use towel racks or bathroom tissue holders to help you stand.  Lighting - Make sure halls, stairways, and entrances are well-lit. Install a night light in your bathroom or hallway. Make sure there is a light switch at the top and bottom of the staircase. Turn lights on if you get up in the middle of the night. Make sure lamps or light switches are within reach of the bed if you have to get up during the night.  Kitchen - Install non-skid rubber mats near the sink and stove. Clean spills immediately. Store frequently used utensils, pots, pans between waist and eye level. This helps prevent reaching and bending. Sit when getting things out of lower cupboards.  Living room/ Bedrooms - Place furniture with wide spaces in between, giving enough room to move around. Establish a route through the living room that gives you something to hold onto as you walk.  Stairs - Make sure treads, rails, and rugs are secure. Install a rail on both sides of the stairs. If stairs are a threat, it might be helpful to arrange most of your activities on the lower  level to reduce the number of times you must climb the stairs.  Entrances and doorways - Install metal handles on the walls adjacent to the doorknobs of all doors to make it more secure as you travel through the doorway.  Tips for maintaining balance Keep at least one hand free at all times. Try using a backpack or fanny pack to hold things rather than carrying them in your hands. Never carry objects in both hands when walking as this  interferes with keeping your balance.  Attempt to swing both arms from front to back while walking. This might require a conscious effort if Parkinson's disease has diminished your movement. It will, however, help you to maintain balance and posture, and reduce fatigue.  Consciously lift your feet off of the ground when walking. Shuffling and dragging of the feet is a common culprit in losing your balance.  When trying to navigate turns, use a "U" technique of facing forward and making a wide turn, rather than pivoting sharply.  Try to stand with your feet shoulder-length apart. When your feet are close together for any length of time, you increase your risk of losing your balance and falling.  Do one thing at a time. Don't try to walk and accomplish another task, such as reading or looking around. The decrease in your automatic reflexes complicates motor function, so the less distraction, the better.  Do not wear rubber or gripping soled shoes, they might "catch" on the floor and cause tripping.  Move slowly when changing positions. Use deliberate, concentrated movements and, if needed, use a grab bar or walking aid. Count 15 seconds between each movement. For example, when rising from a seated position, wait 15 seconds after standing to begin walking.  If balance is a continuous problem, you might want to consider a walking aid such as a cane, walking stick, or walker. Once you've mastered walking with help, you might be ready to try it on your own again.   Follow up in about 3-4 months  The physicians and staff at [facility] are committed to providing excellent care. You may receive a survey requesting feedback about your experience at our office. We strive to receive "very good" responses to the survey questions. If you feel that your experience would prevent you from giving the office a "very good " response, please contact our office to try to remedy the situation. We may be reached  at [phone]. Thank you for taking the time out of your busy day to complete the survey.  Kai Levins, MD

## 2022-07-21 LAB — VITAMIN B12: Vitamin B-12: 361 pg/mL (ref 211–911)

## 2022-07-21 LAB — TSH: TSH: 2.62 u[IU]/mL (ref 0.35–5.50)

## 2022-07-21 LAB — FOLATE: Folate: 12.9 ng/mL (ref >5.9)

## 2022-07-21 NOTE — Addendum Note (Signed)
Addended by: Leida Lauth on: 07/21/2022 07:53 AM   Modules accepted: Orders

## 2022-07-25 ENCOUNTER — Encounter: Payer: Medicare Other | Admitting: Neurology

## 2022-07-25 ENCOUNTER — Other Ambulatory Visit: Payer: Self-pay | Admitting: Family Medicine

## 2022-07-27 LAB — VITAMIN B1: Vitamin B1 (Thiamine): 15 nmol/L (ref 8–30)

## 2022-07-28 ENCOUNTER — Encounter: Payer: Self-pay | Admitting: Neurology

## 2022-08-02 ENCOUNTER — Telehealth: Payer: Self-pay

## 2022-08-02 ENCOUNTER — Encounter: Payer: Self-pay | Admitting: Neurology

## 2022-08-02 NOTE — Telephone Encounter (Signed)
Called and she said to resent it. Done

## 2022-08-03 ENCOUNTER — Ambulatory Visit
Admission: RE | Admit: 2022-08-03 | Discharge: 2022-08-03 | Disposition: A | Discharge status: Home / Self Care | Payer: Medicare Other | Source: Ambulatory Visit | Attending: Neurology | Admitting: Neurology

## 2022-08-03 DIAGNOSIS — R269 Unspecified abnormalities of gait and mobility: Secondary | ICD-10-CM

## 2022-08-03 DIAGNOSIS — M6289 Other specified disorders of muscle: Secondary | ICD-10-CM

## 2022-08-03 DIAGNOSIS — E1142 Type 2 diabetes mellitus with diabetic polyneuropathy: Secondary | ICD-10-CM

## 2022-08-03 DIAGNOSIS — W19XXXD Unspecified fall, subsequent encounter: Secondary | ICD-10-CM

## 2022-08-05 ENCOUNTER — Encounter: Payer: Self-pay | Admitting: Neurology

## 2022-08-12 ENCOUNTER — Ambulatory Visit: Payer: Medicare Other | Admitting: Family Medicine

## 2022-08-15 ENCOUNTER — Telehealth: Payer: Self-pay

## 2022-08-15 NOTE — Telephone Encounter (Signed)
Called office of Dr. Luetta [phone] ) and left a message to call our or fax the results of his single fiber EMG. The message reported that they would get back with [place] with in 24 hours. This was a 9:36 am.

## 2022-08-16 ENCOUNTER — Encounter: Payer: Self-pay | Admitting: Neurology

## 2022-08-16 NOTE — Telephone Encounter (Signed)
Patient's wife called and said they have gotten the results from [place] with [facility] for the tests Dr. Berdine Addison is needing.   She said the results are concerning to them and they'd like to move the patient's December visit up to as soon as possible.

## 2022-08-16 NOTE — Telephone Encounter (Signed)
Dr. Berdine Addison called Patient and he is scheduled to come in on Friday. 08/19/22

## 2022-08-16 NOTE — Telephone Encounter (Signed)
Called office of Dr. Zigmund Daniel again this am and left message to return call or fax inform that is needed

## 2022-08-16 NOTE — Progress Notes (Signed)
I saw Ralph Jensen in neurology clinic on 08/19/22 in follow up for ptosis.  HPI: Ralph Jensen is a 76 y.o. year old male with a history of aortic valve regurgitation, DM2, depression, and memory difficulties (on aricept) who we last saw on 07/20/22.  To briefly review: Patient was previously seen in this office by Dr. Arbutus Leas (first on 02/25/22 then 04/11/22) for evaluation of parkinsonism. At that evaluation, patient endorsed double vision and right eye drooping when patient is tired or in the evening. Due to these concerns, myasthenia gravis was considered. AChR abs were sent and negative (binding, blocking, and modulating - 02/25/22). Subsequently, MuSK ab were also negative (04/11/22).   Wife helps with history today. She has noticed his eyes are drooping for about the past 5 years at least. It is worse when he is tired. Wife will tell him to open his eyes, and he will, but then they will droop again. It has an irregular pattern per wife. Patient does not notice the drooping. He endorses occasional double vision but this is associated with position changes. He does not have double vision when reading the paper, even for long periods of time. He has occasional slurred speech. Per wife, this occurs during episodes when he is "doppy" with his memory problems. He denies any difficulties with chewing or swallowing. He denies choking. He denies neuromuscular respiratory weakness (orthopnea > dyspnea). He denies arm weakness, but does have left shoulder problem. He has diabetic neuropathy but denies significant weakness in his legs. He has fallen in the past so he walks with a walker or cane because his legs get tired. He uses the walker to sit after walking because he feels like his legs are getting weak. He falls about every other week. He does endorse low back pain. He denies muscle pain.   He was previously in PT, but does not do the exercises at home. He is not as active as he was due to not  having a good place to walk.   Per wife, he has an oddly strong sense of smell. He sometimes smells things that his wife does not smell (stinking). He hearing is also very sensitive. He denies changes in taste sensation.   He report any constitutional symptoms like fever, night sweats, anorexia or unintentional weight loss.   EtOH use: Once per month, 1 beer  Restrictive diet? No Family history of neuropathy/myopathy/NM disease? Not that he knows   He did have an EMG years ago that showed diabetic neuropathy.   He wakes up in the morning and is very tired. Wife has wanted to get him a sleep study, but patient will not do it.  Since their last visit: Patient is basically the same as prior. His major problems are memory and imbalance when walking. Wife has not noticed him having droopy eyes since he was seen last. He is shuffling more per wife. He drags the right leg compared to the left.  Patient had MRI cervical spine on 08/03/22 that showed no spinal stenosis but moderate right foraminal narrowing at C5-6.  Single fiber EMG at [facility] showed a mean MCD of 47.7 (normal < 43.8 uSec) with 23% of fiber pairs demonstrating increased jitter and remaining 77% normal (< 74.1 uSec). (Results are being scanned into the chart)  Sleep? Patient thinks his sleep is good. He does not feel rested in the morning. Per wife, he is usually half a sleep all morning. He falls asleep throughout the day. He  snores, but wife does not think he has apnea (her late husband had sleep apnea) Mood? Doing well on increased Wellbutrin dosage.  He has had formal neuropsych testing (x2, in [place]). 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  Current MG symptoms: Ptosis: none since last clinic visit Double vision: very rare Speech: ?late at night changes, usually when sleepy at night Chewing: none Swallowing: none Breathing: none Arm strength: feels  weaker, but no clear fluctuations Leg strength: feels weaker, but no clear fluctuations   MEDICATIONS:  Outpatient Encounter Medications as of 08/19/2022  Medication Sig   acetaminophen (TYLENOL) 500 MG tablet Take 500 mg by mouth every 6 (six) hours as needed.   atorvastatin (LIPITOR) 10 MG tablet Take 1 tablet (10 mg total) by mouth daily.   buPROPion (WELLBUTRIN XL) 150 MG 24 hr tablet Take 1 tablet (150 mg total) by mouth daily. Take in the evening in addition to 300mg  to make total of 450mg /day   buPROPion (WELLBUTRIN XL) 300 MG 24 hr tablet Take 1 tablet (300 mg total) by mouth daily.   dapagliflozin propanediol (FARXIGA) 5 MG TABS tablet Take 1 tablet (5 mg total) by mouth daily before breakfast. (Patient taking differently: Take 10 mg by mouth daily before breakfast.)   donepezil (ARICEPT) 10 MG tablet Take 1 tablet (10 mg total) by mouth daily.   EPINEPHrine 0.3 mg/0.3 mL IJ SOAJ injection Inject 0.3 mg into the muscle as needed for anaphylaxis.   carbidopa-levodopa (SINEMET IR) 25-100 MG tablet Take 1 tablet by mouth 4 (four) times daily. (Patient not taking: Reported on 08/19/2022)   No facility-administered encounter medications on file as of 08/19/2022.    PAST MEDICAL HISTORY: Past Medical History:  Diagnosis Date   Aortic valve stenosis    Cognitive change    Diabetic acidosis, type I (HCC)    Heart murmur     PAST SURGICAL HISTORY: Past Surgical History:  Procedure Laterality Date   APPENDECTOMY     KIDNEY STONE SURGERY     SHOULDER ARTHROSCOPY WITH ROTATOR CUFF REPAIR Left     ALLERGIES: Allergies  Allergen Reactions   Bee Venom    Fluoxetine    Metformin And Related     FAMILY HISTORY: Family History  Problem Relation Age of Onset   Heart attack Mother    Heart disease Sister    COPD Sister     SOCIAL HISTORY: Social History   Tobacco Use   Smoking status: Never    Passive exposure: Never   Smokeless tobacco: Never  Vaping Use   Vaping Use:  Never used  Substance Use Topics   Alcohol use: Never    Comment: rarely   Drug use: Never   Social History   Social History Narrative   Right handed   Retired   Lives with wife one story home   Caffeine none    Objective:  Vital Signs:  BP 115/73   Pulse 73   Ht 5\' 6"  (1.676 m)   Wt 175 lb (79.4 kg)   SpO2 96%   BMI 28.25 kg/m   General: General appearance: Awake and alert. No distress. Cooperative with exam. Skin: No rash or jaundice. HEENT: Atraumatic. Anicteric. Lungs: Non-labored breathing on room air. Extremities: No edema. No obvious deformity.  Musculoskeletal: No obvious joint swelling.  Neurological: Mental Status: Alert. Speech fluent. No pseudobulbar affect Cranial Nerves: CNII: No RAPD. Visual fields intact. CNIII, IV,  VI: PERRL. No nystagmus. EOMI. No diplopia with sustained up gaze. CN V: Facial sensation intact bilaterally to fine touch. CN VII: Facial muscles symmetric and strong. No ptosis at rest or after sustained upgaze. CN VIII: Hears finger rub well bilaterally. CN IX: No hypophonia. CN X: Palate elevates symmetrically. CN XI: Full strength shoulder shrug bilaterally. CN XII: Tongue protrusion full and midline. No atrophy or fasciculations. No significant dysarthria Motor: Tone is normal today (not as increased in RUE and RLE as I previously documented). No fasciculations in  extremities. No atrophy.  Individual muscle group testing (MRC grade out of 5):  Movement     Neck flexion 5    Neck extension 5     Right Left   Shoulder abduction 5 5 Not fatigable  Elbow flexion 5 5   Elbow extension 5 5   Finger abduction - FDI 5 5    Finger abduction - ADM 5 5   Finger extension 5 5   Finger dist flex - 2/3 5 5    Finger dist flex - 4/5 5 5    Thumb flexion (FPL) 5 5   Thumb abduction (APB) 5 5   Hip flexion 5 5   Knee extension 5 5   Knee flexion 5 5   Dorsiflexion 5 5   Plantarflexion 5 5     Reflexes:  Right Left  Bicep 2+  2+  Tricep 2+ 2+  BrRad 2+ 2+  Knee 3+ 2+  Ankle 0 0   Pathological Reflexes: Babinski: Mute response bilaterally Sensation: Intact to light touch in all extremities Coordination: Intact finger-to- nose-finger and heel-to-shin bilaterally. Romberg negative. Gait: Narrow-based gait, somewhat unsteady. Using a cane for assistance today.   Lab and Test Review: AChR ab (blocking, binding, modulating) (02/25/22): negative MuSK ab (04/11/22): negative HbA1c (02/09/22): 6.5  07/20/22 labs: Normal or unremarkable: B1, folate, TSH B12 (361)  MRI cervical spine wo contrast (08/03/22): FINDINGS: Alignment: No listhesis.   Vertebrae: No acute fracture or suspicious osseous lesion.   Cord: Normal signal and morphology.   Posterior Fossa, vertebral arteries, paraspinal tissues: Negative.   Disc levels:   C2-C3: Small central disc protrusion. Left-greater-than-right facet and uncovertebral hypertrophy. No spinal canal stenosis. Mild left neural foraminal narrowing.   C3-C4: Minimal disc bulge. Right-greater-than-left facet and uncovertebral hypertrophy. No spinal canal stenosis. Mild left and mild-to-moderate right neural foraminal narrowing.   C4-C5: Mild disc bulge. Facet and uncovertebral hypertrophy. No spinal canal stenosis. Mild bilateral neural foraminal narrowing.   C5-C6: Disc height loss with disc osteophyte complex and mild disc bulge. Right-greater-than-left facet and uncovertebral hypertrophy. No spinal canal stenosis. Moderate to severe right and mild left neural foraminal narrowing.   C6-C7: Disc height loss and minimal disc bulge. Facet and uncovertebral hypertrophy. No spinal canal stenosis. Mild left neural foraminal narrowing.   C7-T1: Mild disc bulge. No spinal canal stenosis or neural foraminal narrowing.   IMPRESSION: 1. No spinal canal stenosis. 2. Multilevel facet and uncovertebral hypertrophy, which causes moderate right neural foraminal narrowing at  C5-C6, with additional less significant neural foraminal narrowing seen at almost every cervical level.  Single fiber EMG (08/12/22 at [facility] by Dr. Bufford Buttner): Testing Results:  SFEMG: Single fiber EMG was performed using a disposable standard single fiber EMG electrode. Studies were performed of the left frontalis muscle under voluntary control. 21 fiber pairs were examined with a mean MCD of 47.7 uSec (ref <43.8 uSec). 23 % of fiber pairs demonstrated increased jitter with blocking and  the remaining 77% were normal (< 74.1 uSec).  ___________________________________________________________________________________ Conclusion: This study is abnormal. 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  MRI brain wo contrast (05/02/22): FINDINGS: Brain: Generalized age related volume loss. No focal abnormality seen affecting the brainstem or cerebellum. Cerebral hemispheres show minimal/mild chronic small-vessel ischemic change of the white matter. No cortical or large vessel territory infarction. No mass lesion, hemorrhage, hydrocephalus or extra-axial collection.   Vascular: Major vessels at the base of the brain show flow.   Skull and upper cervical spine: Negative   Sinuses/Orbits: Clear/normal   Other: Question ferromagnetic foreign object in the left frontal scalp with subsequent artifact.   IMPRESSION: No acute or reversible finding. Generalized age related volume loss. Minimal/mild chronic small-vessel ischemic change of the cerebral hemispheric white matter.   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  ASSESSMENT: Ralph Jensen is a 76 y.o. male who presents for follow up evaluation of ptosis, falls, and cognitive changes. He has a relevant medical history of DM, HLD, depression, and memory difficulties. His neurological examination is pertinent for no ptosis, diplopia, or fatigable  weakness. He does have diminished sensation in bilateral lower extremities and gait imbalance. Available diagnostic data is significant for negative AChR abs and MuSK abs. His MRI brain showed moderate atrophy out of proportion to age but no obvious focal lesions. His MRI cervical spine showed no spinal stenosis with moderate right neural foraminal narrowing at C5-6. His single fiber EMG at [facility] was abnormal.   Overall, based on patient's history, examination, and antibody testing, I believe the diagnosis of myasthenia gravis is less likely, even with the abnormal single fiber EMG. Single fiber EMG is very sensitive, but not as specific for MG (70-80%). We discussed that an abnormal result does not necessarily mean he has myasthenia, but that we have to at least monitor for new or worsening symptoms. We discussed a possible mestinon trial, however, in the absence of current symptoms, it would be hard to know if the mestinon is having any effect. As a result, patient and his wife elected to monitor symptoms closely and alert me if any MG symptoms (diplopia, ptosis, chewing/swallowing difficulty, shortness of breath when laying, arm or leg weakness).  The episodes of confusion and memory difficulties could be related to his poor sleep vs mild cognitive impairment vs a neurodegenerative disorder such as dementia. Certainly improving his sleep would be a good first step. Patient has previously been resistant to a sleep study, but after discussion today on the importance of sleep and how it might help his symptoms, patient agreed to a sleep study and sleep medicine evaluation. He has had multiple neuropsych evaluations in the past ranging from normal to mild cognitive impairment.   Regarding his falls, the etiology is still unclear. There was increased tone at last visit that I do not appreciate as much today. His MRI brain and cervical spine were unrevealing. I will order a few other lab tests today to  evaluate.  Plan: -Blood work: copper, vit E, MMA -Recommend sleep study -Referral to sleep medicine -Discussed Mestinon trial. Patient and wife deferred. -May consider EMG with [facility] in the future should it be clinically warranted -Will monitor for MG symptoms  Return to clinic as planned on 11/02/22  Total time spent reviewing records, interview, history/exam, documentation, and coordination of care on day of encounter:  50 min  Ralph Levins, MD

## 2022-08-19 ENCOUNTER — Ambulatory Visit (INDEPENDENT_AMBULATORY_CARE_PROVIDER_SITE_OTHER): Payer: Medicare Other | Admitting: Neurology

## 2022-08-19 ENCOUNTER — Encounter: Payer: Self-pay | Admitting: Neurology

## 2022-08-19 ENCOUNTER — Other Ambulatory Visit (INDEPENDENT_AMBULATORY_CARE_PROVIDER_SITE_OTHER): Payer: Medicare Other

## 2022-08-19 VITALS — BP 115/73 | HR 73 | Ht 66.0 in | Wt 175.0 lb

## 2022-08-19 DIAGNOSIS — R400 Somnolence: Secondary | ICD-10-CM

## 2022-08-19 DIAGNOSIS — W19XXXD Unspecified fall, subsequent encounter: Secondary | ICD-10-CM

## 2022-08-19 DIAGNOSIS — H02401 Unspecified ptosis of right eyelid: Secondary | ICD-10-CM

## 2022-08-19 DIAGNOSIS — R269 Unspecified abnormalities of gait and mobility: Secondary | ICD-10-CM

## 2022-08-19 DIAGNOSIS — E1142 Type 2 diabetes mellitus with diabetic polyneuropathy: Secondary | ICD-10-CM | POA: Diagnosis not present

## 2022-08-19 DIAGNOSIS — M6289 Other specified disorders of muscle: Secondary | ICD-10-CM

## 2022-08-19 DIAGNOSIS — Z72820 Sleep deprivation: Secondary | ICD-10-CM

## 2022-08-19 NOTE — Patient Instructions (Addendum)
What we want to do today: -Blood work today -Sleep study -Sleep medicine referral (sleep doctor)  Monitor for double vision, droopy eyelids, or muscle weakness. If you have any, call our office.  Follow up with me in December as planned or sooner if needed.  The physicians and staff at [facility] are committed to providing excellent care. You may receive a survey requesting feedback about your experience at our office. We strive to receive "very good" responses to the survey questions. If you feel that your experience would prevent you from giving the office a "very good " response, please contact our office to try to remedy the situation. We may be reached at [phone]. Thank you for taking the time out of your busy day to complete the survey.  Kai Levins, MD [facility]

## 2022-08-23 ENCOUNTER — Encounter: Payer: Self-pay | Admitting: Neurology

## 2022-08-23 LAB — COPPER, SERUM: Copper: 94 ug/dL (ref 70–175)

## 2022-08-23 LAB — METHYLMALONIC ACID, SERUM: Methylmalonic Acid, Quant: 134 nmol/L (ref 87–318)

## 2022-08-31 ENCOUNTER — Institutional Professional Consult (permissible substitution): Payer: Medicare Other | Admitting: Primary Care

## 2022-09-14 ENCOUNTER — Institutional Professional Consult (permissible substitution): Payer: Medicare Other | Admitting: Primary Care

## 2022-09-16 ENCOUNTER — Ambulatory Visit (HOSPITAL_BASED_OUTPATIENT_CLINIC_OR_DEPARTMENT_OTHER): Payer: Medicare Other | Attending: Neurology | Admitting: Internal Medicine

## 2022-09-16 DIAGNOSIS — Z72820 Sleep deprivation: Secondary | ICD-10-CM | POA: Insufficient documentation

## 2022-09-16 DIAGNOSIS — R269 Unspecified abnormalities of gait and mobility: Secondary | ICD-10-CM | POA: Insufficient documentation

## 2022-09-16 DIAGNOSIS — E1142 Type 2 diabetes mellitus with diabetic polyneuropathy: Secondary | ICD-10-CM | POA: Diagnosis present

## 2022-09-16 DIAGNOSIS — M6289 Other specified disorders of muscle: Secondary | ICD-10-CM | POA: Diagnosis not present

## 2022-09-16 DIAGNOSIS — W19XXXD Unspecified fall, subsequent encounter: Secondary | ICD-10-CM | POA: Diagnosis not present

## 2022-09-16 DIAGNOSIS — R400 Somnolence: Secondary | ICD-10-CM

## 2022-09-16 DIAGNOSIS — H02401 Unspecified ptosis of right eyelid: Secondary | ICD-10-CM | POA: Diagnosis not present

## 2022-09-25 DIAGNOSIS — E1142 Type 2 diabetes mellitus with diabetic polyneuropathy: Secondary | ICD-10-CM | POA: Diagnosis not present

## 2022-09-25 NOTE — Procedures (Signed)
    Patient Name: Ralph Jensen, Ralph Jensen Date: 09/17/2022 Gender: Male D.O.B: 1946/10/17 Age (years): 32 Referring Provider: Shellia Carwin Height (inches): 17 Interpreting Physician: Baird Lyons MD, ABSM Weight (lbs): 170 RPSGT: Jacolyn Reedy BMI: 28 MRN: [id number] Neck Size: <br>  CLINICAL INFORMATION Sleep Study Type: HST Indication for sleep study: Insomnia with OSA Epworth Sleepiness Score: 8  SLEEP STUDY TECHNIQUE A multi-channel overnight portable sleep study was performed. The channels recorded were: nasal airflow, thoracic respiratory movement, and oxygen saturation with a pulse oximetry. Snoring was also monitored.  MEDICATIONS Patient self administered medications include: none reported.  SLEEP ARCHITECTURE Patient was studied for 434.5 minutes. The sleep efficiency was 100.0 % and the patient was supine for 0%. The arousal index was 0.0 per hour.  RESPIRATORY PARAMETERS The overall AHI was 3.7 per hour, with a central apnea index of 0 per hour. The oxygen nadir was 72% during sleep.  CARDIAC DATA Mean heart rate during sleep was 70.0 bpm.  IMPRESSIONS - No significant obstructive sleep apnea occurred during this study (AHI = 3.7/h). - Oxygen desaturation was noted during this study (Min O2 = 72%). Mean 93%. Time with O2 saturation 89% or less was 1.7 minutes. - Patient snored.  DIAGNOSIS - Normal study  RECOMMENDATIONS - Manage for symptoms based on clinical judgment. - Be careful with alcohol, sedatives and other CNS depressants that may worsen sleep apnea and disrupt normal sleep architecture. - Sleep hygiene should be reviewed to assess factors that may improve sleep quality. - Weight management and regular exercise should be initiated or continued. - Consider in-center study if these results are not consistent with clinical impression.  [Electronically signed] 09/25/2022 11:52 AM  Baird Lyons MD, ABSM Diplomate, American Board of Sleep  Medicine NPI: [id number]                         [place] [place] [facility]  ELECTRONICALLY SIGNED ON:  09/25/2022, 11:47 AM [place] PH: [phone]   FX: [phone] [place]

## 2022-09-26 ENCOUNTER — Telehealth: Payer: Self-pay

## 2022-09-26 NOTE — Telephone Encounter (Signed)
-----   Message from Shellia Carwin, MD sent at 09/26/2022 12:42 PM EDT ----- Regarding: Sleep study results Can we let the patient know that his sleep study was normal (surprisingly)? Thank you.  [facility]

## 2022-09-26 NOTE — Telephone Encounter (Signed)
Left message on machine for patient to call back.

## 2022-09-27 NOTE — Telephone Encounter (Signed)
Called and gave results per Dr. Berdine Addison to wife. She understood and will let husband know.

## 2022-09-27 NOTE — Telephone Encounter (Signed)
Pt's wife called in returning our call about results

## 2022-10-28 NOTE — Progress Notes (Unsigned)
I saw Ralph Jensen in neurology clinic on 11/02/22 in follow up for falls, cognitive changes, ptosis.  HPI: Ralph Jensen is a 76 y.o. year old male with a history of aortic valve regurgitation, DM2, depression, and memory difficulties (on aricept) who we last saw on 08/19/22.  To briefly review: Patient was previously seen in this office by Dr. Arbutus Leas (first on 02/25/22 then 04/11/22) for evaluation of parkinsonism. At that evaluation, patient endorsed double vision and right eye drooping when patient is tired or in the evening. Due to these concerns, myasthenia gravis was considered. AChR abs were sent and negative (binding, blocking, and modulating - 02/25/22). Subsequently, MuSK ab were also negative (04/11/22).   Wife has noticed his eyes are drooping for about the past 5 years at least. It is worse when he is tired. Wife will tell him to open his eyes, and he will, but then they will droop again. It has an irregular pattern per wife. Patient does not notice the drooping. He endorses occasional double vision but this is associated with position changes. He does not have double vision when reading the paper, even for long periods of time. He has occasional slurred speech. Per wife, this occurs during episodes when he is "doppy" with his memory problems. He denies any difficulties with chewing or swallowing. He denies choking. He denies neuromuscular respiratory weakness (orthopnea > dyspnea). He denies arm weakness, but does have left shoulder problem. He has diabetic neuropathy but denies significant weakness in his legs. He has fallen in the past so he walks with a walker or cane because his legs get tired. He uses the walker to sit after walking because he feels like his legs are getting weak. He falls about every other week. He does endorse low back pain. He denies muscle pain.   He was previously in PT, but does not do the exercises at home. He is not as active as he was due to not having  a good place to walk.   Per wife, he has an oddly strong sense of smell. He sometimes smells things that his wife does not smell (stinking). He hearing is also very sensitive. He denies changes in taste sensation.   He report any constitutional symptoms like fever, night sweats, anorexia or unintentional weight loss.   EtOH use: Once per month, 1 beer  Restrictive diet? No Family history of neuropathy/myopathy/NM disease? Not that he knows   He did have an EMG years ago that showed diabetic neuropathy.   He wakes up in the morning and is very tired. Wife has wanted to get him a sleep study, but patient will not do it.  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   MRI brain w/o contrast on 05/02/22 showed Generalized age related volume loss.  MRI cervical spine wo contrast on 08/03/22 showed moderate right C5-6 neural foraminal narrowing with less significant narrowing at most every level.  08/19/22 Assessment: Overall, based on patient's history, examination, and antibody testing, I believe the diagnosis of myasthenia gravis is less likely, even with the abnormal single fiber EMG. Single fiber EMG is very sensitive, but not as specific for MG (70-80%). We discussed that an abnormal result does not necessarily mean he has myasthenia, but that we have to at least monitor for new or worsening symptoms. We discussed a possible mestinon trial, however, in the absence of current symptoms, it would be  hard to know if the mestinon is having any effect. As a result, patient and his wife elected to monitor symptoms closely and alert me if any MG symptoms (diplopia, ptosis, chewing/swallowing difficulty, shortness of breath when laying, arm or leg weakness).   The episodes of confusion and memory difficulties could be related to his poor sleep vs mild cognitive impairment vs a neurodegenerative disorder such as  dementia. Certainly improving his sleep would be a good first step. Patient has previously been resistant to a sleep study, but after discussion today on the importance of sleep and how it might help his symptoms, patient agreed to a sleep study and sleep medicine evaluation. He has had multiple neuropsych evaluations in the past ranging from normal to mild cognitive impairment.   Regarding his falls, the etiology is still unclear. There was increased tone at last visit that I do not appreciate as much today. His MRI brain and cervical spine were unrevealing. I will order a few other lab tests today to evaluate.   Plan: -Blood work: copper, vit E, MMA -Recommend sleep study -Referral to sleep medicine -Discussed Mestinon trial. Patient and wife deferred. -May consider EMG with [facility] in the future should it be clinically warranted -Will monitor for MG symptoms  Since their last visit: Patient and wife have moved into smaller home. This has significantly lowered his anxiety. Wife thinks he has improved in this regard. Per wife, he still has memory difficulties.  Blood work (copper, vit E, MMA) after last visit was normal.  Sleep study was normal.  Current MG like symptoms: Ptosis: nothing significant Double vision: very occasional, not significant Speech: no difficulties Chewing: no difficulties Swallowing: no difficulties Breathing: no difficulties Arm strength: no issues Leg strength: no issues  Patient had one fall while setting up a train. He was bending over, not paying attention, and lost balance.    MEDICATIONS:  Outpatient Encounter Medications as of 11/02/2022  Medication Sig   acetaminophen (TYLENOL) 500 MG tablet Take 500 mg by mouth every 6 (six) hours as needed.   atorvastatin (LIPITOR) 10 MG tablet Take 1 tablet (10 mg total) by mouth daily.   buPROPion (WELLBUTRIN XL) 150 MG 24 hr tablet Take 1 tablet (150 mg total) by mouth daily. Take in the evening in addition to  300mg  to make total of 450mg /day   buPROPion (WELLBUTRIN XL) 300 MG 24 hr tablet Take 1 tablet (300 mg total) by mouth daily.   dapagliflozin propanediol (FARXIGA) 5 MG TABS tablet Take 1 tablet (5 mg total) by mouth daily before breakfast. (Patient taking differently: Take 10 mg by mouth daily before breakfast.)   donepezil (ARICEPT) 10 MG tablet Take 1 tablet (10 mg total) by mouth daily.   EPINEPHrine 0.3 mg/0.3 mL IJ SOAJ injection Inject 0.3 mg into the muscle as needed for anaphylaxis.   No facility-administered encounter medications on file as of 11/02/2022.    PAST MEDICAL HISTORY: Past Medical History:  Diagnosis Date   Aortic valve stenosis    Cognitive change    Diabetic acidosis, type I (HCC)    Heart murmur     PAST SURGICAL HISTORY: Past Surgical History:  Procedure Laterality Date   APPENDECTOMY     KIDNEY STONE SURGERY     SHOULDER ARTHROSCOPY WITH ROTATOR CUFF REPAIR Left     ALLERGIES: Allergies  Allergen Reactions   Bee Venom    Fluoxetine    Metformin And Related     FAMILY HISTORY: Family History  Problem Relation Age of Onset   Heart attack Mother    Heart disease Sister    COPD Sister     SOCIAL HISTORY: Social History   Tobacco Use   Smoking status: Never    Passive exposure: Never   Smokeless tobacco: Never  Vaping Use   Vaping Use: Never used  Substance Use Topics   Alcohol use: Never    Comment: rarely   Drug use: Never   Social History   Social History Narrative   Right handed   Retired   Lives with wife one story home   Caffeine none    Objective:  Vital Signs:  BP 109/69   Pulse 80   Ht 5\' 6"  (1.676 m)   Wt 173 lb (78.5 kg)   SpO2 96%   BMI 27.92 kg/m   General: General appearance: Awake and alert. No distress. Cooperative with exam. Skin: No rash or jaundice. HEENT: Atraumatic. Anicteric. Lungs: Non-labored breathing on room air   Neurological: Mental Status: Alert. Speech fluent. No pseudobulbar  affect Cranial Nerves: CNII: No RAPD. Visual fields intact. CNIII, IV, VI: PERRL. No nystagmus. EOMI. CN V: Facial sensation intact bilaterally to fine touch. CN VII: Facial muscles symmetric and strong. No ptosis at rest. CN VIII: Hears finger rub well bilaterally. CN IX: No hypophonia. CN X: Palate elevates symmetrically. CN XI: Full strength shoulder shrug bilaterally. CN XII: Tongue protrusion full and midline. No atrophy or fasciculations. No significant dysarthria Motor: Tone is normal. 5/5 in bilateral upper and lower extremities   Reflexes:  Right Left  Bicep 2+ 2+  Tricep 2+ 2+  BrRad 2+ 2+  Knee 2+ 2+  Ankle 0 0   Sensation: Pinprick: Intact in all extremities Vibration: Reduced in bilateral feet Gait: Normal, narrow-based gait.   Lab and Test Review: New results: 08/19/22 labs: copper, MMA wnl  Sleep study (09/16/22): SLEEP ARCHITECTURE Patient was studied for 434.5 minutes. The sleep efficiency was 100.0 % and the patient was supine for 0%. The arousal index was 0.0 per hour.   RESPIRATORY PARAMETERS The overall AHI was 3.7 per hour, with a central apnea index of 0 per hour. The oxygen nadir was 72% during sleep.   CARDIAC DATA Mean heart rate during sleep was 70.0 bpm.   IMPRESSIONS - No significant obstructive sleep apnea occurred during this study (AHI = 3.7/h). - Oxygen desaturation was noted during this study (Min O2 = 72%). Mean 93%. Time with O2 saturation 89% or less was 1.7 minutes. - Patient snored.   DIAGNOSIS - Normal study   RECOMMENDATIONS - Manage for symptoms based on clinical judgment. - Be careful with alcohol, sedatives and other CNS depressants that may worsen sleep apnea and disrupt normal sleep architecture. - Sleep hygiene should be reviewed to assess factors that may improve sleep quality. - Weight management and regular exercise should be initiated or continued. - Consider in-center study if these results are not  consistent with clinical impression.  Previously reviewed testing: AChR ab (blocking, binding, modulating) (02/25/22): negative MuSK ab (04/11/22): negative HbA1c (02/09/22): 6.5   07/20/22 labs: Normal or unremarkable: B1, folate, TSH B12 (361)   MRI cervical spine wo contrast (08/03/22): FINDINGS: Alignment: No listhesis.   Vertebrae: No acute fracture or suspicious osseous lesion.   Cord: Normal signal and morphology.   Posterior Fossa, vertebral arteries, paraspinal tissues: Negative.   Disc levels:   C2-C3: Small central disc protrusion. Left-greater-than-right facet and uncovertebral hypertrophy. No spinal canal stenosis. Mild left  neural foraminal narrowing.   C3-C4: Minimal disc bulge. Right-greater-than-left facet and uncovertebral hypertrophy. No spinal canal stenosis. Mild left and mild-to-moderate right neural foraminal narrowing.   C4-C5: Mild disc bulge. Facet and uncovertebral hypertrophy. No spinal canal stenosis. Mild bilateral neural foraminal narrowing.   C5-C6: Disc height loss with disc osteophyte complex and mild disc bulge. Right-greater-than-left facet and uncovertebral hypertrophy. No spinal canal stenosis. Moderate to severe right and mild left neural foraminal narrowing.   C6-C7: Disc height loss and minimal disc bulge. Facet and uncovertebral hypertrophy. No spinal canal stenosis. Mild left neural foraminal narrowing.   C7-T1: Mild disc bulge. No spinal canal stenosis or neural foraminal narrowing.   IMPRESSION: 1. No spinal canal stenosis. 2. Multilevel facet and uncovertebral hypertrophy, which causes moderate right neural foraminal narrowing at C5-C6, with additional less significant neural foraminal narrowing seen at almost every cervical level.   Single fiber EMG (08/12/22 at [facility] by Dr. Bufford Buttner): Testing Results:  SFEMG: Single fiber EMG was performed using a disposable standard single fiber EMG electrode. Studies were performed  of the left frontalis muscle under voluntary control. 21 fiber pairs were examined with a mean MCD of 47.7 uSec (ref <43.8 uSec). 23 % of fiber pairs demonstrated increased jitter with blocking and the remaining 77% were normal (< 74.1 uSec).  ___________________________________________________________________________________ Conclusion: This study is abnormal. 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   MRI brain wo contrast (05/02/22): FINDINGS: Brain: Generalized age related volume loss. No focal abnormality seen affecting the brainstem or cerebellum. Cerebral hemispheres show minimal/mild chronic small-vessel ischemic change of the white matter. No cortical or large vessel territory infarction. No mass lesion, hemorrhage, hydrocephalus or extra-axial collection.   Vascular: Major vessels at the base of the brain show flow.   Skull and upper cervical spine: Negative   Sinuses/Orbits: Clear/normal   Other: Question ferromagnetic foreign object in the left frontal scalp with subsequent artifact.   IMPRESSION: No acute or reversible finding. Generalized age related volume loss. Minimal/mild chronic small-vessel ischemic change of the cerebral hemispheric white matter.   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  ASSESSMENT: This is Ralph Jensen, a 77 y.o. male with fall and cognitive change. Patient initially saw me for ptosis and concern for MG. These symptoms are not significant currently. Despite the positive single fiber EMG, I see no evidence of myasthenia gravis. I will continue to monitor. 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 His memory and cognitive complaints are most consistent with mild cognitive impairment. There has been no change in his symptoms since our initial assessment. I discussed re-testing  with neuropsych testing, but patient and wife deferred and would prefer to monitor symptoms.  Plan: -Continue donepezil for memory changes and cognitive complaints -Will continue to monitor for possible MG -Will continue to monitor memory and cognitive function -Fall precautions discussed  Return to clinic in 6 months  Total time spent reviewing records, interview, history/exam, documentation, and coordination of care on day of encounter:  30 min  Jacquelyne Balint, MD

## 2022-11-02 ENCOUNTER — Ambulatory Visit (INDEPENDENT_AMBULATORY_CARE_PROVIDER_SITE_OTHER): Payer: Medicare Other | Admitting: Neurology

## 2022-11-02 ENCOUNTER — Encounter: Payer: Self-pay | Admitting: Neurology

## 2022-11-02 VITALS — BP 109/69 | HR 80 | Ht 66.0 in | Wt 173.0 lb

## 2022-11-02 DIAGNOSIS — W19XXXD Unspecified fall, subsequent encounter: Secondary | ICD-10-CM

## 2022-11-02 DIAGNOSIS — E1142 Type 2 diabetes mellitus with diabetic polyneuropathy: Secondary | ICD-10-CM

## 2022-11-02 DIAGNOSIS — R269 Unspecified abnormalities of gait and mobility: Secondary | ICD-10-CM | POA: Diagnosis not present

## 2022-11-02 NOTE — Patient Instructions (Signed)
Preventing Falls at [facility] are common, often dreaded events in the lives of older people. Aside from the obvious injuries and even death that may result, fall can cause wide-ranging consequences including loss of independence, mental decline, decreased activity and mobility. Younger people are also at risk of falling, especially those with chronic illnesses and fatigue.  Ways to reduce risk for falling Examine diet and medications. Warm foods and alcohol dilate blood vessels, which can lead to dizziness when standing. Sleep aids, antidepressants and pain medications can also increase the likelihood of a fall.  Get a vision exam. Poor vision, cataracts and glaucoma increase the chances of falling.  Check foot gear. Shoes should fit snugly and have a sturdy, nonskid sole and a broad, low heel  Participate in a physician-approved exercise program to build and maintain muscle strength and improve balance and coordination. Programs that use ankle weights or stretch bands are excellent for muscle-strengthening. Water aerobics programs and low-impact Tai Chi programs have also been shown to improve balance and coordination.  Increase vitamin D intake. Vitamin D improves muscle strength and increases the amount of calcium the body is able to absorb and deposit in bones.  How to prevent falls from common hazards Floors - Remove all loose wires, cords, and throw rugs. Minimize clutter. Make sure rugs are anchored and smooth. Keep furniture in its usual place.  Chairs -- Use chairs with straight backs, armrests and firm seats. Add firm cushions to existing pieces to add height.  Bathroom - Install grab bars and non-skid tape in the tub or shower. Use a bathtub transfer bench or a shower chair with a back support Use an elevated toilet seat and/or safety rails to assist standing from a low surface. Do not use towel racks or bathroom tissue holders to help you stand.  Lighting - Make sure halls,  stairways, and entrances are well-lit. Install a night light in your bathroom or hallway. Make sure there is a light switch at the top and bottom of the staircase. Turn lights on if you get up in the middle of the night. Make sure lamps or light switches are within reach of the bed if you have to get up during the night.  Kitchen - Install non-skid rubber mats near the sink and stove. Clean spills immediately. Store frequently used utensils, pots, pans between waist and eye level. This helps prevent reaching and bending. Sit when getting things out of lower cupboards.  Living room/ Bedrooms - Place furniture with wide spaces in between, giving enough room to move around. Establish a route through the living room that gives you something to hold onto as you walk.  Stairs - Make sure treads, rails, and rugs are secure. Install a rail on both sides of the stairs. If stairs are a threat, it might be helpful to arrange most of your activities on the lower level to reduce the number of times you must climb the stairs.  Entrances and doorways - Install metal handles on the walls adjacent to the doorknobs of all doors to make it more secure as you travel through the doorway.  Tips for maintaining balance Keep at least one hand free at all times. Try using a backpack or fanny pack to hold things rather than carrying them in your hands. Never carry objects in both hands when walking as this interferes with keeping your balance.  Attempt to swing both arms from front to back while walking. This might require a conscious  effort if Parkinson's disease has diminished your movement. It will, however, help you to maintain balance and posture, and reduce fatigue.  Consciously lift your feet off of the ground when walking. Shuffling and dragging of the feet is a common culprit in losing your balance.  When trying to navigate turns, use a "U" technique of facing forward and making a wide turn, rather than pivoting  sharply.  Try to stand with your feet shoulder-length apart. When your feet are close together for any length of time, you increase your risk of losing your balance and falling.  Do one thing at a time. Don't try to walk and accomplish another task, such as reading or looking around. The decrease in your automatic reflexes complicates motor function, so the less distraction, the better.  Do not wear rubber or gripping soled shoes, they might "catch" on the floor and cause tripping.  Move slowly when changing positions. Use deliberate, concentrated movements and, if needed, use a grab bar or walking aid. Count 15 seconds between each movement. For example, when rising from a seated position, wait 15 seconds after standing to begin walking.  If balance is a continuous problem, you might want to consider a walking aid such as a cane, walking stick, or walker. Once you've mastered walking with help, you might be ready to try it on your own again.

## 2022-11-10 LAB — HEMOGLOBIN A1C: Hemoglobin A1C: 6.7

## 2022-12-15 ENCOUNTER — Ambulatory Visit (INDEPENDENT_AMBULATORY_CARE_PROVIDER_SITE_OTHER): Payer: Medicare Other

## 2022-12-15 ENCOUNTER — Ambulatory Visit (INDEPENDENT_AMBULATORY_CARE_PROVIDER_SITE_OTHER): Payer: Medicare Other | Admitting: Family Medicine

## 2022-12-15 ENCOUNTER — Encounter: Payer: Self-pay | Admitting: Family Medicine

## 2022-12-15 VITALS — BP 108/64 | HR 76 | Ht 66.0 in | Wt 174.0 lb

## 2022-12-15 DIAGNOSIS — R079 Chest pain, unspecified: Secondary | ICD-10-CM

## 2022-12-15 DIAGNOSIS — R918 Other nonspecific abnormal finding of lung field: Secondary | ICD-10-CM

## 2022-12-15 DIAGNOSIS — E119 Type 2 diabetes mellitus without complications: Secondary | ICD-10-CM

## 2022-12-15 NOTE — Progress Notes (Signed)
Acute Office Visit  Subjective:     Patient ID: Ralph Jensen, male    DOB: 10-25-1946, 77 y.o.   MRN: [id number]  Chief Complaint  Patient presents with   left sided pain    Pt reports that the pain started on Monday. He has been taking 1,000 mg of Tylenol for the pain. Denies any trauma or injury, no f/s/c/n/v. His BM have been normal. He states that the pain is 8/10 whenever he moves, coughs or bends the pain is sharp. Otherwise the pain is 4/10     HPI Patient is in today for left side pain.  Pt reports that the pain started on Monday. He has been taking 1,000 mg of Tylenol for the pain. Denies any trauma or injury, no f/s/c/n/v. His BM have been normal. He states that the pain is 8/10 whenever he moves,orr bends the pain is sharp. Otherwise the pain is 4/10.  He says the Tylenol is not really helping he does not report any cough, etc. no blood in the stool.  Urine appears to be normal he has not noticed any changes he does have some nocturia but it is not new.  He has not noticed any rash.   ROS      Objective:    BP 108/64   Pulse 76   Ht 5\' 6"  (1.676 m)   Wt 174 lb (78.9 kg)   SpO2 99%   BMI 28.08 kg/m    Physical Exam Vitals reviewed.  Constitutional:      Appearance: He is well-developed.  HENT:     Head: Normocephalic and atraumatic.  Eyes:     Conjunctiva/sclera: Conjunctivae normal.  Cardiovascular:     Rate and Rhythm: Normal rate and regular rhythm.     Heart sounds: Normal heart sounds.  Pulmonary:     Effort: Pulmonary effort is normal.     Breath sounds: Normal breath sounds.  Chest:       Comments: Aread of pain. Nontender on exam.   Abdominal:     General: Bowel sounds are normal. There is no distension.     Palpations: Abdomen is soft. There is no mass.     Tenderness: There is abdominal tenderness.     Comments: Mild tenderness on the left lateral abdomen.   Musculoskeletal:     Comments: Non - Tender directly over the thoracic  spine.  Skin:    General: Skin is warm and dry.     Coloration: Skin is not pale.  Neurological:     Mental Status: He is alert and oriented to person, place, and time.  Psychiatric:        Behavior: Behavior normal.     Results for orders placed or performed in visit on 12/15/22  Hemoglobin A1c  Result Value Ref Range   Hemoglobin A1C 6.7   Microalbumin / creatinine urine ratio  Result Value Ref Range   Microalb Creat Ratio 9.6   Protein / creatinine ratio, urine  Result Value Ref Range   Creatinine, Urine 69   Protein / creatinine ratio, urine  Result Value Ref Range   Albumin, U 6.7         Assessment & Plan:   Problem List Items Addressed This Visit       Endocrine   Type 2 diabetes mellitus without complication, without long-term current use of insulin ([place]) - Primary    He would like to have his A1C checked today.  Relevant Orders   CBC with Differential/Platelet   COMPLETE METABOLIC PANEL WITH GFR   Lipase   Hemoglobin A1c   Other Visit Diagnoses     Left-sided chest pain       Relevant Orders   CBC with Differential/Platelet   COMPLETE METABOLIC PANEL WITH GFR   Lipase   Hemoglobin A1c   DG Ribs Unilateral W/Chest Left       Lateral chest pain over the rib area.  It sounds most consistent with a rib fracture since it is worse with movement coughing and sneezing.  But he does not have tenderness with direct palpation of the ribs which is a little bit unusual.  He is not having any other red flag symptoms or abdominal symptoms or GI symptoms.  We did discuss monitoring for rash in case it could be early shingles.  Okay to continue with Tylenol and NSAID if needed.  Discussed getting some updated labs just to rule out underlying infection or pancreatitis though he is not having any nausea or pain with eating.  Also get a chest x-ray with rib film.  If all normal and pain persist after the weekend then please workup further.  I did encourage him to  call back if at any point he develops any new or worsening symptoms.  No orders of the defined types were placed in this encounter.   No follow-ups on file.  Beatrice Lecher, MD

## 2022-12-15 NOTE — Assessment & Plan Note (Signed)
He would like to have his A1C checked today.

## 2022-12-16 LAB — COMPLETE METABOLIC PANEL WITH GFR
AG Ratio: 1.5 (calc) (ref 1.0–2.5)
ALT: 16 U/L (ref 9–46)
AST: 16 U/L (ref 10–35)
Albumin: 4.8 g/dL (ref 3.6–5.1)
Alkaline phosphatase (APISO): 77 U/L (ref 35–144)
BUN: 23 mg/dL (ref 7–25)
CO2: 24 mmol/L (ref 20–32)
Calcium: 11 mg/dL — ABNORMAL HIGH (ref 8.6–10.3)
Chloride: 104 mmol/L (ref 98–110)
Creat: 1.25 mg/dL (ref 0.70–1.28)
Globulin: 3.1 g/dL (calc) (ref 1.9–3.7)
Glucose, Bld: 153 mg/dL — ABNORMAL HIGH (ref 65–99)
Potassium: 4.9 mmol/L (ref 3.5–5.3)
Sodium: 144 mmol/L (ref 135–146)
Total Bilirubin: 1 mg/dL (ref 0.2–1.2)
Total Protein: 7.9 g/dL (ref 6.1–8.1)
eGFR: 59 mL/min/{1.73_m2} — ABNORMAL LOW (ref >=60)

## 2022-12-16 LAB — CBC WITH DIFFERENTIAL/PLATELET
Absolute Monocytes: 836 cells/uL (ref 200–950)
Basophils Absolute: 68 cells/uL (ref 0–200)
Basophils Relative: 0.9 %
Eosinophils Absolute: 182 cells/uL (ref 15–500)
Eosinophils Relative: 2.4 %
HCT: 51.6 % — ABNORMAL HIGH (ref 38.5–50.0)
Hemoglobin: 17.5 g/dL — ABNORMAL HIGH (ref 13.2–17.1)
Lymphs Abs: 1885 cells/uL (ref 850–3900)
MCH: 30.4 pg (ref 27.0–33.0)
MCHC: 33.9 g/dL (ref 32.0–36.0)
MCV: 89.7 fL (ref 80.0–100.0)
MPV: 9.1 fL (ref 7.5–12.5)
Monocytes Relative: 11 %
Neutro Abs: 4628 cells/uL (ref 1500–7800)
Neutrophils Relative %: 60.9 %
Platelets: 264 10*3/uL (ref 140–400)
RBC: 5.75 10*6/uL (ref 4.20–5.80)
RDW: 13.1 % (ref 11.0–15.0)
Total Lymphocyte: 24.8 %
WBC: 7.6 10*3/uL (ref 3.8–10.8)

## 2022-12-16 LAB — HEMOGLOBIN A1C
Hgb A1c MFr Bld: 7 % of total Hgb — ABNORMAL HIGH (ref <5.7)
Mean Plasma Glucose: 154 mg/dL
eAG (mmol/L): 8.5 mmol/L

## 2022-12-16 LAB — LIPASE: Lipase: 52 U/L (ref 7–60)

## 2022-12-16 NOTE — Addendum Note (Signed)
Addended by: Beatrice Lecher D on: 12/16/2022 10:25 AM   Modules accepted: Orders

## 2022-12-16 NOTE — Progress Notes (Signed)
Spoke with Ronalee Belts and [place] about results. Pain is worse today but no new sxs. Will order CT. He is due for 6 mo f/u CT chest anyway for nodules

## 2022-12-16 NOTE — Progress Notes (Signed)
Results reviewed with patient and his wife.

## 2022-12-19 ENCOUNTER — Encounter: Payer: Self-pay | Admitting: Family Medicine

## 2022-12-19 ENCOUNTER — Other Ambulatory Visit: Payer: Medicare Other

## 2023-01-21 ENCOUNTER — Other Ambulatory Visit: Payer: Self-pay | Admitting: Family Medicine

## 2023-02-03 ENCOUNTER — Telehealth: Payer: Self-pay | Admitting: Family Medicine

## 2023-02-03 NOTE — Telephone Encounter (Signed)
Contacted Ralph Jensen to schedule their annual wellness visit. Patient declined to schedule AWV at this time.  [name] Patient [employer] administrator II Direct Dial: [phone]

## 2023-03-15 ENCOUNTER — Ambulatory Visit (INDEPENDENT_AMBULATORY_CARE_PROVIDER_SITE_OTHER): Payer: Medicare Other

## 2023-03-15 ENCOUNTER — Encounter: Payer: Self-pay | Admitting: Family Medicine

## 2023-03-15 ENCOUNTER — Ambulatory Visit (INDEPENDENT_AMBULATORY_CARE_PROVIDER_SITE_OTHER): Payer: Medicare Other | Admitting: Family Medicine

## 2023-03-15 VITALS — BP 115/69 | HR 89 | Ht 66.0 in | Wt 172.0 lb

## 2023-03-15 DIAGNOSIS — J309 Allergic rhinitis, unspecified: Secondary | ICD-10-CM | POA: Insufficient documentation

## 2023-03-15 DIAGNOSIS — J301 Allergic rhinitis due to pollen: Secondary | ICD-10-CM | POA: Diagnosis not present

## 2023-03-15 DIAGNOSIS — M25551 Pain in right hip: Secondary | ICD-10-CM | POA: Diagnosis not present

## 2023-03-15 NOTE — Progress Notes (Signed)
Ralph Jensen - 77 y.o. male MRN [id number]  Date of birth: 11-29-1945  Subjective Chief Complaint  Patient presents with   Allergic Rhinitis     HPI Ralph Jensen is a 77 year old male accompanied today by his wife.  He reports having nasal congestion, sneezing, eye itching and watering and scratchy throat for the past couple weeks.  He has not really had this in the past.  Recently moved to the area couple years ago.  Has not tried anything so far for this.  He is also having pain in the right hip shows.  Pain in the anterior hip and groin area.  Worse with standing for long periods of time or walking.  No radiation into the leg.  ROS:  A comprehensive ROS was completed and negative except as noted per HPI  Allergies  Allergen Reactions   Bee Venom    Fluoxetine    Metformin And Related     Past Medical History:  Diagnosis Date   Aortic valve stenosis    Cognitive change    Diabetic acidosis, type I    Heart murmur     Past Surgical History:  Procedure Laterality Date   APPENDECTOMY     KIDNEY STONE SURGERY     SHOULDER ARTHROSCOPY WITH ROTATOR CUFF REPAIR Left     Social History   Socioeconomic History   Marital status: Married    Spouse name: Not on file   Number of children: Not on file   Years of education: Not on file   Highest education level: Not on file  Occupational History   Occupation: retired    Comment: Paramedic  Tobacco Use   Smoking status: Never    Passive exposure: Never   Smokeless tobacco: Never  Vaping Use   Vaping Use: Never used  Substance and Sexual Activity   Alcohol use: Never    Comment: rarely   Drug use: Never   Sexual activity: Not Currently    Partners: Female  Other Topics Concern   Not on file  Social History Narrative   Right handed   Retired   Lives with wife one story home   Caffeine none   Social Determinants of Health   Financial Resource Strain: Not on file  Food Insecurity: Not on file   Transportation Needs: Not on file  Physical Activity: Not on file  Stress: Not on file  Social Connections: Not on file    Family History  Problem Relation Age of Onset   Heart attack Mother    Heart disease Sister    COPD Sister     Health Maintenance  Topic Date Due   DTaP/Tdap/Td (1 - Tdap) Never done   Pneumonia Vaccine 60+ Years old (2 of 2 - PPSV23 or PCV20) 08/07/2017   FOOT EXAM  04/28/2023 (Originally 07/03/1956)   Medicare Annual Wellness (AWV)  06/15/2023 (Originally 08-09-1946)   Hepatitis C Screening  12/16/2023 (Originally 07/03/1964)   COVID-19 Vaccine (5 - 2023-24 season) 01/01/2024 (Originally 07/29/2022)   OPHTHALMOLOGY EXAM  04/09/2023   HEMOGLOBIN A1C  06/15/2023   Diabetic kidney evaluation - Urine ACR  06/16/2023   INFLUENZA VACCINE  06/29/2023   Diabetic kidney evaluation - eGFR measurement  12/16/2023   Zoster Vaccines- Shingrix  Completed   HPV VACCINES  Aged Out     ----------------------------------------------------------------------------------------------------------------------------------------------------------------------------------------------------------------- Physical Exam BP 115/69 (BP Location: Left Arm, Patient Position: Sitting, Cuff Size: Normal)   Pulse 89   Ht  (1.676 m)  Wt 172 lb (78 kg)   SpO2 97%   BMI 27.76 kg/m   Physical Exam Constitutional:      Appearance: Normal appearance.  HENT:     Head: Normocephalic and atraumatic.  Eyes:     General: No scleral icterus. Cardiovascular:     Rate and Rhythm: Normal rate and regular rhythm.  Pulmonary:     Effort: Pulmonary effort is normal.     Breath sounds: Normal breath sounds.  Musculoskeletal:     Cervical back: Neck supple.  Neurological:     Mental Status: He is alert.  Psychiatric:        Mood and Affect: Mood normal.        Behavior: Behavior normal.      ------------------------------------------------------------------------------------------------------------------------------------------------------------------------------------------------------------------- Assessment and Plan  Allergic rhinitis I recommended that he try oral antihistamine as well as either Flonase or Astepro.  Right hip pain X-rays of right hip ordered.  He may use Tylenol as needed.   No orders of the defined types were placed in this encounter.   No follow-ups on file.    This visit occurred during the SARS-CoV-2 public health emergency.  Safety protocols were in place, including screening questions prior to the visit, additional usage of staff PPE, and extensive cleaning of exam room while observing appropriate contact time as indicated for disinfecting solutions.

## 2023-03-15 NOTE — Assessment & Plan Note (Signed)
I recommended that he try oral antihistamine as well as either Flonase or Astepro.

## 2023-03-15 NOTE — Patient Instructions (Addendum)
I would recommend using zyrtec or allegra for allergies.  You can also use flonase or astepro nasal spray.   We'll be in touch with hip xray results and recommendations.    Allergic Rhinitis, Adult  Allergic rhinitis is a reaction to allergens. Allergens are things that can cause an allergic reaction. This condition affects the lining inside the nose (mucous membrane). There are two types of allergic rhinitis: Seasonal. This type is also called hay fever. It happens only during some times of the year. Perennial. This type can happen at any time of the year. This condition cannot be spread from person to person (is not contagious). It can be mild, bad, or very bad. It can develop at any age and may be outgrown. What are the causes? Pollen from grasses, trees, and weeds. Other causes can be: Dust mites. Smoke. Mold. Car fumes. The pee (urine), spit, or dander of pets. Dander is dead skin cells from a pet. What increases the risk? You are more likely to develop this condition if: You have allergies in your family. You have problems like allergies in your family. You may have: Swelling of parts of your eyes and eyelids. Asthma. This affects how you breathe. Long-term redness and swelling on your skin. Food allergies. What are the signs or symptoms? The main symptom of this condition is a runny or stuffy nose (nasal congestion). Other symptoms may include: Sneezing or coughing. Itching and tearing of your eyes. Mucus that drips down the back of your throat (postnasal drip). This may cause a sore throat. Trouble sleeping. Feeling tired. Headache. How is this treated? There is no cure for this condition. You should avoid things that you are allergic to. Treatment can help to relieve symptoms. This may include: Medicines that block allergy symptoms, such as anti-inflammatories or antihistamines. These may be given as a shot, nasal spray, or pill. Avoiding things you are allergic  to. Medicines that give you some of what you are allergic to over time. This is called immunotherapy. It is done if other treatments do not help. You may get: Shots. Medicine under your tongue. Stronger medicines, if other treatments do not help. Follow these instructions at home: Avoiding allergens Find out what things you are allergic to and avoid them. To do this, try these things: If you get allergies any time of year: Replace carpet with wood, tile, or vinyl flooring. Carpet can trap pet dander and dust. Do not smoke. Do not allow smoking in your home. Change your heating and air conditioning filters at least once a month. If you get allergies only some times of the year: Keep windows closed when you can. Plan things to do outside when pollen counts are lowest. Check pollen counts before you plan things to do outside. When you come indoors, change your clothes and shower before you sit on furniture or bedding. If you are allergic to a pet: Keep the pet out of your bedroom. Vacuum, sweep, and dust often. General instructions Take over-the-counter and prescription medicines only as told by your doctor. Drink enough fluid to keep your pee pale yellow. Where to find more information American Academy of [employer], Asthma & Immunology: aaaai.org Contact a doctor if: You have a fever. You get a cough that does not go away. You make high-pitched whistling sounds when you breathe, most often when you breathe out (wheeze). Your symptoms slow you down. Your symptoms stop you from doing your normal things each day. Get help right away if:  You are short of breath. This symptom may be an emergency. Get help right away. Call 911. Do not wait to see if the symptom will go away. Do not drive yourself to the hospital. This information is not intended to replace advice given to you by your health care provider. Make sure you discuss any questions you have with your health care provider. Document  Revised: 07/25/2022 Document Reviewed: 07/25/2022 Elsevier Patient Education  2023 [employer].

## 2023-03-15 NOTE — Assessment & Plan Note (Signed)
X-rays of right hip ordered.  He may use Tylenol as needed.

## 2023-04-17 ENCOUNTER — Other Ambulatory Visit: Payer: Self-pay | Admitting: Family Medicine

## 2023-04-17 DIAGNOSIS — F418 Other specified anxiety disorders: Secondary | ICD-10-CM

## 2023-04-19 NOTE — Progress Notes (Signed)
NEUROLOGY FOLLOW UP OFFICE NOTE  Ralph Jensen [id number]  Subjective:  Ralph Jensen is a 77 y.o. year old male with a history of aortic valve regurgitation, DM2, depression, and memory difficulties (on aricept) who we last saw on 11/02/22.  To briefly review: Patient was previously seen in this office by Dr. Arbutus Leas (first on 02/25/22 then 04/11/22) for evaluation of parkinsonism. At that evaluation, patient endorsed double vision and right eye drooping when patient is tired or in the evening. Due to these concerns, myasthenia gravis was considered. AChR abs were sent and negative (binding, blocking, and modulating - 02/25/22). Subsequently, MuSK ab were also negative (04/11/22).   Wife has noticed his eyes are drooping for about the past 5 years at least. It is worse when he is tired. Wife will tell him to open his eyes, and he will, but then they will droop again. It has an irregular pattern per wife. Patient does not notice the drooping. He endorses occasional double vision but this is associated with position changes. He does not have double vision when reading the paper, even for long periods of time. He has occasional slurred speech. Per wife, this occurs during episodes when he is "doppy" with his memory problems. He denies any difficulties with chewing or swallowing. He denies choking. He denies neuromuscular respiratory weakness (orthopnea > dyspnea). He denies arm weakness, but does have left shoulder problem. He has diabetic neuropathy but denies significant weakness in his legs. He has fallen in the past so he walks with a walker or cane because his legs get tired. He uses the walker to sit after walking because he feels like his legs are getting weak. He falls about every other week. He does endorse low back pain. He denies muscle pain.   He was previously in PT, but does not do the exercises at home. He is not as active as he was due to not having a good place to walk.   Per  wife, he has an oddly strong sense of smell. He sometimes smells things that his wife does not smell (stinking). He hearing is also very sensitive. He denies changes in taste sensation.   He report any constitutional symptoms like fever, night sweats, anorexia or unintentional weight loss.   EtOH use: Once per month, 1 beer  Restrictive diet? No Family history of neuropathy/myopathy/NM disease? Not that he knows   He did have an EMG years ago that showed diabetic neuropathy.   He wakes up in the morning and is very tired. Wife has wanted to get him a sleep study, but patient will not do it.   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    MRI brain w/o contrast on 05/02/22 showed Generalized age related volume loss.   MRI cervical spine wo contrast on 08/03/22 showed moderate right C5-6 neural foraminal narrowing with less significant narrowing at most every level.  11/02/22: Patient and wife have moved into smaller home. This has significantly lowered his anxiety. Wife thinks he has improved in this regard. Per wife, he still has memory difficulties.   Blood work (copper, vit E, MMA) after last visit was normal.   Sleep study was normal.   Current MG like symptoms: Ptosis: nothing significant Double vision: very occasional, not significant Speech: no difficulties Chewing: no difficulties Swallowing: no difficulties Breathing: no difficulties Arm strength: no issues Leg strength: no issues   Patient  had one fall while setting up a train. He was bending over, not paying attention, and lost balance.  Most recent Assessment and Plan (11/02/22): This is Ralph Jensen, a 77 y.o. male with fall and cognitive change. Patient initially saw me for ptosis and concern for MG. These symptoms are not significant currently. Despite the positive single fiber EMG, I see no evidence of myasthenia  gravis. I will continue to monitor. 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 His memory and cognitive complaints are most consistent with mild cognitive impairment. There has been no change in his symptoms since our initial assessment. I discussed re-testing with neuropsych testing, but patient and wife deferred and would prefer to monitor symptoms.   Plan: -Continue donepezil for memory changes and cognitive complaints -Will continue to monitor for possible MG -Will continue to monitor memory and cognitive function -Fall precautions discussed  Since their last visit: Patient had one fall since last visit. It occurred at night and patient thinks he may have been half asleep. He was going to the restroom and became off balance and he fell backwards. He did not hurt himself.  Symptom check (MG like symptoms): Ptosis: None Double vision: None Speech: Occasional Chewing: No problems Swallowing: No problems Breathing: No issues, sleeps on 1 large pillow (does for neck comfort) Arm strength: No issues Leg strength: Gets tired. Uses a walker in case he needs sit when walking further distances. Uses a cane when out.   He has more difficulty when walking in grass (less balance). He also feels his hands are weaker than previous.  He feels that his memory may be a little worse. He mentions asking questions but not remembering a few hours later. He has not been getting lost in familiar places. He is independent of all ADLs.  MEDICATIONS:  Outpatient Encounter Medications as of 05/03/2023  Medication Sig   acetaminophen (TYLENOL) 500 MG tablet Take 500 mg by mouth every 6 (six) hours as needed.   atorvastatin (LIPITOR) 10 MG tablet Take 1 tablet (10 mg total) by mouth daily.   buPROPion (WELLBUTRIN XL) 150 MG 24 hr tablet Take 1 tablet (150 mg total) by mouth daily. Take in the evening in addition to 300mg  to make total of  450mg /day   buPROPion (WELLBUTRIN XL) 300 MG 24 hr tablet Take 1 tablet (300 mg total) by mouth daily.   dapagliflozin propanediol (FARXIGA) 5 MG TABS tablet Take 1 tablet (5 mg total) by mouth daily before breakfast. (Patient taking differently: Take 10 mg by mouth daily before breakfast.)   donepezil (ARICEPT) 10 MG tablet Take 1 tablet (10 mg total) by mouth daily.   EPINEPHrine 0.3 mg/0.3 mL IJ SOAJ injection Inject 0.3 mg into the muscle as needed for anaphylaxis.   [DISCONTINUED] atorvastatin (LIPITOR) 10 MG tablet Take 1 tablet (10 mg total) by mouth daily.   [DISCONTINUED] buPROPion (WELLBUTRIN XL) 300 MG 24 hr tablet Take 1 tablet (300 mg total) by mouth daily.   [DISCONTINUED] donepezil (ARICEPT) 10 MG tablet Take 1 tablet (10 mg total) by mouth daily.   No facility-administered encounter medications on file as of 05/03/2023.    PAST MEDICAL HISTORY: Past Medical History:  Diagnosis Date   Aortic valve stenosis    Cognitive change    Diabetic acidosis, type I (HCC)    Heart murmur     PAST SURGICAL HISTORY: Past Surgical History:  Procedure Laterality Date   APPENDECTOMY  KIDNEY STONE SURGERY     SHOULDER ARTHROSCOPY WITH ROTATOR CUFF REPAIR Left     ALLERGIES: Allergies  Allergen Reactions   Bee Venom    Fluoxetine    Metformin And Related     FAMILY HISTORY: Family History  Problem Relation Age of Onset   Heart attack Mother    Heart disease Sister    COPD Sister     SOCIAL HISTORY: Social History   Tobacco Use   Smoking status: Never    Passive exposure: Never   Smokeless tobacco: Never  Vaping Use   Vaping Use: Never used  Substance Use Topics   Alcohol use: Never    Comment: rarely   Drug use: Never   Social History   Social History Narrative   Right handed   Retired   Lives with wife one story home   Caffeine none      Objective:  Vital Signs:  BP 116/62   Pulse 78   Ht 5\' 6"  (1.676 m)   Wt 180 lb 6.4 oz (81.8 kg)   SpO2 98%    BMI 29.12 kg/m   General: General appearance: Awake and alert. No distress. Cooperative with exam.  Skin: No obvious rash or jaundice. HEENT: Atraumatic. Anicteric. Lungs: Non-labored breathing on room air  Psych: Affect appropriate.  Neurological: Mental Status: Alert. Speech fluent. No pseudobulbar affect Cranial Nerves: CNII: No RAPD. Visual fields intact. CNIII, IV, VI: PERRL. No nystagmus. EOMI. CN V: Facial sensation intact bilaterally to fine touch. CN VII: Facial muscles symmetric and strong. No ptosis at rest. Occasionally will sit with right eye closed, but able to open with no obvious ptosis. CN VIII: Hears finger rub well bilaterally. CN IX: No hypophonia. CN X: Palate elevates symmetrically. CN XI: Full strength shoulder shrug bilaterally. CN XII: Tongue protrusion full and midline. No atrophy or fasciculations. No significant dysarthria Motor: Tone is normal. Mild atrophy of bilateral APB.  Individual muscle group testing (MRC grade out of 5):  Movement     Neck flexion 5    Neck extension 5     Right Left   Shoulder abduction 5 5   Elbow flexion 5 5   Elbow extension 5 5   Finger abduction - FDI 5 5   Finger abduction - ADM 5 5   Finger extension 5 5   Finger distal flexion - 2/3 5 5    Finger distal flexion - 4/5 5 5    Thumb flexion - FPL 5 5   Thumb abduction - APB 4+ 4+    Hip flexion 5 5   Knee extension 5 5   Knee flexion 5 5   Dorsiflexion 5 5   Plantarflexion 5 5    Reflexes:  Right Left   Bicep 2+ 2+   Tricep 2+ 2+   BrRad 2+ 2+   Knee 2+ 2+   Ankle 0 0    Sensation: Pinprick intact in bilateral upper extremities. Diminished in bilateral lower extremities to the calves Coordination: Intact finger-to- nose-finger bilaterally. Romberg with mild sway. Gait: Narrow-based, mildly unsteady gait. No freezing, no en bloc turns.   Labs and Imaging review: New results: 12/15/22: HbA1c 7.0 CMP significant for elevated glucose (153), Cr  (1.25), and Ca (11) CBC unremarkable  Previously reviewed results: 08/19/22 labs: copper, MMA wnl  AChR ab (blocking, binding, modulating) (02/25/22): negative MuSK ab (04/11/22): negative HbA1c (02/09/22): 6.5   07/20/22 labs: Normal or unremarkable: B1, folate, TSH B12 (361)   Sleep  study (09/16/22): SLEEP ARCHITECTURE Patient was studied for 434.5 minutes. The sleep efficiency was 100.0 % and the patient was supine for 0%. The arousal index was 0.0 per hour.   RESPIRATORY PARAMETERS The overall AHI was 3.7 per hour, with a central apnea index of 0 per hour. The oxygen nadir was 72% during sleep.   CARDIAC DATA Mean heart rate during sleep was 70.0 bpm.   IMPRESSIONS - No significant obstructive sleep apnea occurred during this study (AHI = 3.7/h). - Oxygen desaturation was noted during this study (Min O2 = 72%). Mean 93%. Time with O2 saturation 89% or less was 1.7 minutes. - Patient snored.   DIAGNOSIS - Normal study   RECOMMENDATIONS - Manage for symptoms based on clinical judgment. - Be careful with alcohol, sedatives and other CNS depressants that may worsen sleep apnea and disrupt normal sleep architecture. - Sleep hygiene should be reviewed to assess factors that may improve sleep quality. - Weight management and regular exercise should be initiated or continued. - Consider in-center study if these results are not consistent with clinical impression.   MRI cervical spine wo contrast (08/03/22): FINDINGS: Alignment: No listhesis.   Vertebrae: No acute fracture or suspicious osseous lesion.   Cord: Normal signal and morphology.   Posterior Fossa, vertebral arteries, paraspinal tissues: Negative.   Disc levels:   C2-C3: Small central disc protrusion. Left-greater-than-right facet and uncovertebral hypertrophy. No spinal canal stenosis. Mild left neural foraminal narrowing.   C3-C4: Minimal disc bulge. Right-greater-than-left facet and uncovertebral  hypertrophy. No spinal canal stenosis. Mild left and mild-to-moderate right neural foraminal narrowing.   C4-C5: Mild disc bulge. Facet and uncovertebral hypertrophy. No spinal canal stenosis. Mild bilateral neural foraminal narrowing.   C5-C6: Disc height loss with disc osteophyte complex and mild disc bulge. Right-greater-than-left facet and uncovertebral hypertrophy. No spinal canal stenosis. Moderate to severe right and mild left neural foraminal narrowing.   C6-C7: Disc height loss and minimal disc bulge. Facet and uncovertebral hypertrophy. No spinal canal stenosis. Mild left neural foraminal narrowing.   C7-T1: Mild disc bulge. No spinal canal stenosis or neural foraminal narrowing.   IMPRESSION: 1. No spinal canal stenosis. 2. Multilevel facet and uncovertebral hypertrophy, which causes moderate right neural foraminal narrowing at C5-C6, with additional less significant neural foraminal narrowing seen at almost every cervical level.   Single fiber EMG (08/12/22 at [facility] by Dr. Bufford Buttner): Testing Results:  SFEMG: Single fiber EMG was performed using a disposable standard single fiber EMG electrode. Studies were performed of the left frontalis muscle under voluntary control. 21 fiber pairs were examined with a mean MCD of 47.7 uSec (ref <43.8 uSec). 23 % of fiber pairs demonstrated increased jitter with blocking and the remaining 77% were normal (< 74.1 uSec).  ___________________________________________________________________________________ Conclusion: This study is abnormal. 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   MRI brain wo contrast (05/02/22): FINDINGS: Brain: Generalized age related volume loss. No focal abnormality seen affecting the brainstem or cerebellum. Cerebral hemispheres show minimal/mild chronic small-vessel ischemic change of the white matter. No cortical or large vessel territory infarction. No  mass lesion, hemorrhage, hydrocephalus or extra-axial collection.   Vascular: Major vessels at the base of the brain show flow.   Skull and upper cervical spine: Negative   Sinuses/Orbits: Clear/normal   Other: Question ferromagnetic foreign object in the left frontal scalp with subsequent artifact.   IMPRESSION: No acute or reversible finding. Generalized age related volume loss. Minimal/mild chronic small-vessel ischemic change of the  cerebral hemispheric white matter.   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  Assessment/Plan:  This is Kyzen Rainge, a 77 y.o. male with: cognitive changes, neuropathy, and hand weakness. Patient initially saw me for ptosis and concern for MG. These symptoms are not significant currently. Despite the positive single fiber EMG, I see no evidence of myasthenia gravis. I will continue to monitor. 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 His memory and cognitive complaints are most consistent with mild cognitive impairment. There has been no change in his symptoms since our initial assessment. I have previously discussed re-testing with neuropsych testing, but patient and wife deferred and would prefer to monitor symptoms.  In terms of hand weakness, which patient mentioned today, he does have bilateral thumb abduction weakness and mild atrophy of bilateral APB that could be consistent with carpal tunnel syndrome. When I mentioned this to patient, he stated that he may have been told that in the past. His wife did not remember this, though it could have been before they were married. We discussed EMG, but patient would like to think about it. I also gave patient information on wrist splints, which he will also consider.   Plan: -Continue donepezil for memory changes and cognitive complaints -Will continue to monitor for possible  MG -Will continue to monitor memory and cognitive function -Fall precautions discussed -Discussed possible EMG for hand weakness or wrist splints for possible carpal tunnel syndrome. Patient to consider.  Return to clinic in 6 months  Total time spent reviewing records, interview, history/exam, documentation, and coordination of care on day of encounter:  35 min  Jacquelyne Balint, MD

## 2023-04-26 ENCOUNTER — Other Ambulatory Visit: Payer: Self-pay | Admitting: Family Medicine

## 2023-05-03 ENCOUNTER — Ambulatory Visit (INDEPENDENT_AMBULATORY_CARE_PROVIDER_SITE_OTHER): Payer: Medicare Other | Admitting: Neurology

## 2023-05-03 ENCOUNTER — Encounter: Payer: Self-pay | Admitting: Neurology

## 2023-05-03 VITALS — BP 116/62 | HR 78 | Ht 66.0 in | Wt 180.4 lb

## 2023-05-03 DIAGNOSIS — G3184 Mild cognitive impairment, so stated: Secondary | ICD-10-CM | POA: Diagnosis not present

## 2023-05-03 MED ORDER — DONEPEZIL HCL 10 MG PO TABS: 10.0000 mg | ORAL_TABLET | Freq: Every day | ORAL | 0 refills | Status: DC

## 2023-05-03 NOTE — Patient Instructions (Addendum)
Continue donepezil.  Cock up wrist splint for carpal tunnel symptoms can be bought in local drug stores or online. This should especially be worn at night while sleeping.     I want to see you back in clinic in 6 months or sooner if needed.  The physicians and staff at [facility] are committed to providing excellent care. You may receive a survey requesting feedback about your experience at our office. We strive to receive "very good" responses to the survey questions. If you feel that your experience would prevent you from giving the office a "very good " response, please contact our office to try to remedy the situation. We may be reached at [phone]. Thank you for taking the time out of your busy day to complete the survey.  Jacquelyne Balint, MD [facility]  Preventing Falls at [facility] are common, often dreaded events in the lives of older people. Aside from the obvious injuries and even death that may result, fall can cause wide-ranging consequences including loss of independence, mental decline, decreased activity and mobility. Younger people are also at risk of falling, especially those with chronic illnesses and fatigue.  Ways to reduce risk for falling Examine diet and medications. Warm foods and alcohol dilate blood vessels, which can lead to dizziness when standing. Sleep aids, antidepressants and pain medications can also increase the likelihood of a fall.  Get a vision exam. Poor vision, cataracts and glaucoma increase the chances of falling.  Check foot gear. Shoes should fit snugly and have a sturdy, nonskid sole and a broad, low heel  Participate in a physician-approved exercise program to build and maintain muscle strength and improve balance and coordination. Programs that use ankle weights or stretch bands are excellent for muscle-strengthening. Water aerobics programs and low-impact Tai Chi programs have also been shown to improve balance and  coordination.  Increase vitamin D intake. Vitamin D improves muscle strength and increases the amount of calcium the body is able to absorb and deposit in bones.  How to prevent falls from common hazards Floors - Remove all loose wires, cords, and throw rugs. Minimize clutter. Make sure rugs are anchored and smooth. Keep furniture in its usual place.  Chairs -- Use chairs with straight backs, armrests and firm seats. Add firm cushions to existing pieces to add height.  Bathroom - Install grab bars and non-skid tape in the tub or shower. Use a bathtub transfer bench or a shower chair with a back support Use an elevated toilet seat and/or safety rails to assist standing from a low surface. Do not use towel racks or bathroom tissue holders to help you stand.  Lighting - Make sure halls, stairways, and entrances are well-lit. Install a night light in your bathroom or hallway. Make sure there is a light switch at the top and bottom of the staircase. Turn lights on if you get up in the middle of the night. Make sure lamps or light switches are within reach of the bed if you have to get up during the night.  Kitchen - Install non-skid rubber mats near the sink and stove. Clean spills immediately. Store frequently used utensils, pots, pans between waist and eye level. This helps prevent reaching and bending. Sit when getting things out of lower cupboards.  Living room/ Bedrooms - Place furniture with wide spaces in between, giving enough room to move around. Establish a route through the living room that gives you something to hold onto as you walk.  Stairs - Make sure treads, rails, and rugs are secure. Install a rail on both sides of the stairs. If stairs are a threat, it might be helpful to arrange most of your activities on the lower level to reduce the number of times you must climb the stairs.  Entrances and doorways - Install metal handles on the walls adjacent to the doorknobs of all doors to make  it more secure as you travel through the doorway.  Tips for maintaining balance Keep at least one hand free at all times. Try using a backpack or fanny pack to hold things rather than carrying them in your hands. Never carry objects in both hands when walking as this interferes with keeping your balance.  Attempt to swing both arms from front to back while walking. This might require a conscious effort if Parkinson's disease has diminished your movement. It will, however, help you to maintain balance and posture, and reduce fatigue.  Consciously lift your feet off of the ground when walking. Shuffling and dragging of the feet is a common culprit in losing your balance.  When trying to navigate turns, use a "U" technique of facing forward and making a wide turn, rather than pivoting sharply.  Try to stand with your feet shoulder-length apart. When your feet are close together for any length of time, you increase your risk of losing your balance and falling.  Do one thing at a time. Don't try to walk and accomplish another task, such as reading or looking around. The decrease in your automatic reflexes complicates motor function, so the less distraction, the better.  Do not wear rubber or gripping soled shoes, they might "catch" on the floor and cause tripping.  Move slowly when changing positions. Use deliberate, concentrated movements and, if needed, use a grab bar or walking aid. Count 15 seconds between each movement. For example, when rising from a seated position, wait 15 seconds after standing to begin walking.  If balance is a continuous problem, you might want to consider a walking aid such as a cane, walking stick, or walker. Once you've mastered walking with help, you might be ready to try it on your own again.

## 2023-05-18 LAB — HEMOGLOBIN A1C: Hemoglobin A1C: 6.9

## 2023-05-22 ENCOUNTER — Telehealth: Payer: Self-pay | Admitting: Family Medicine

## 2023-05-22 MED ORDER — BUPROPION HCL ER (XL) 300 MG PO TB24: 300.0000 mg | ORAL_TABLET | Freq: Every day | ORAL | 0 refills | Status: DC

## 2023-05-22 MED ORDER — BUPROPION HCL ER (XL) 150 MG PO TB24: 150.0000 mg | ORAL_TABLET | Freq: Every day | ORAL | 0 refills | Status: DC

## 2023-05-22 NOTE — Telephone Encounter (Signed)
Patient called requesting a refill of buPROPion (WELLBUTRIN XL) 150 MG 24 hr tablet [[id number]] and buPROPion (WELLBUTRIN XL) 300 MG 24 hr tablet [[id number]]   Pharmacy ;  [facility] - [place], [place] - [address]

## 2023-05-23 NOTE — Telephone Encounter (Signed)
Refill sent in yesterday. Roselyn Reef, CMA

## 2023-06-26 ENCOUNTER — Encounter: Payer: Self-pay | Admitting: Family Medicine

## 2023-06-26 ENCOUNTER — Ambulatory Visit: Payer: Medicare Other | Admitting: Family Medicine

## 2023-06-26 VITALS — BP 100/57 | HR 83 | Ht 66.0 in | Wt 178.0 lb

## 2023-06-26 DIAGNOSIS — E7800 Pure hypercholesterolemia, unspecified: Secondary | ICD-10-CM

## 2023-06-26 DIAGNOSIS — G3184 Mild cognitive impairment, so stated: Secondary | ICD-10-CM | POA: Diagnosis not present

## 2023-06-26 DIAGNOSIS — F418 Other specified anxiety disorders: Secondary | ICD-10-CM | POA: Diagnosis not present

## 2023-06-26 DIAGNOSIS — E119 Type 2 diabetes mellitus without complications: Secondary | ICD-10-CM

## 2023-06-26 MED ORDER — EPINEPHRINE 0.3 MG/0.3ML IJ SOAJ: 0.3000 mg | INTRAMUSCULAR | 1 refills | Status: AC | PRN

## 2023-06-26 MED ORDER — ATORVASTATIN CALCIUM 10 MG PO TABS: 10.0000 mg | ORAL_TABLET | Freq: Every day | ORAL | 1 refills | Status: AC

## 2023-06-26 MED ORDER — DAPAGLIFLOZIN PROPANEDIOL 5 MG PO TABS: 5.0000 mg | ORAL_TABLET | Freq: Every day | ORAL | 1 refills | Status: AC

## 2023-06-26 MED ORDER — BUPROPION HCL ER (XL) 300 MG PO TB24: 300.0000 mg | ORAL_TABLET | Freq: Every day | ORAL | 1 refills | Status: AC

## 2023-06-26 MED ORDER — BUPROPION HCL ER (XL) 150 MG PO TB24: 150.0000 mg | ORAL_TABLET | Freq: Every day | ORAL | 1 refills | Status: AC

## 2023-06-26 MED ORDER — DONEPEZIL HCL 10 MG PO TABS
10.0000 mg | ORAL_TABLET | Freq: Every day | ORAL | 1 refills | Status: AC
Start: 2023-06-26 — End: ?

## 2023-06-26 NOTE — Assessment & Plan Note (Signed)
Continue atorvastatin at current strength.  

## 2023-06-26 NOTE — Progress Notes (Signed)
Ralph Jensen - 77 y.o. male MRN [id number]  Date of birth: July 20, 1946  Subjective Chief Complaint  Patient presents with   Medication Refill    HPI Ralph Jensen is a 77 y.o. male here today for follow up visit.   Overall he is doing pretty well at this time.  He and his wife are moving back to [place] in a few weeks.  He needs medication refills to last him until he gets reestablished with a primary care physician there.    Continues on bupropion 450 mg daily.  This continues to work well for him.  No side effects at current strength.  Remains on Aricept for cognitive impairment.  He was diagnosed with Parkinson's while living in [place] however neurologist that he has been seeing here does not feel that he has this.  He has been off of carbidopa/levodopa without any significant decline in his symptoms.  Remains on Farxiga for management of blood sugar.  Tolerating this well.  Blood sugars have overall been well-controlled.  He remains on atorvastatin for associated hyperlipidemia.  ROS:  A comprehensive ROS was completed and negative except as noted per HPI    Allergies  Allergen Reactions   Bee Venom    Fluoxetine    Metformin And Related     Past Medical History:  Diagnosis Date   Aortic valve stenosis    Cognitive change    Diabetic acidosis, type I (HCC)    Heart murmur     Past Surgical History:  Procedure Laterality Date   APPENDECTOMY     KIDNEY STONE SURGERY     SHOULDER ARTHROSCOPY WITH ROTATOR CUFF REPAIR Left     Social History   Socioeconomic History   Marital status: Married    Spouse name: Not on file   Number of children: Not on file   Years of education: Not on file   Highest education level: Not on file  Occupational History   Occupation: retired    Comment: Paramedic  Tobacco Use   Smoking status: Never    Passive exposure: Never   Smokeless tobacco: Never  Vaping Use   Vaping status: Never Used  Substance  and Sexual Activity   Alcohol use: Never    Comment: rarely   Drug use: Never   Sexual activity: Not Currently    Partners: Female  Other Topics Concern   Not on file  Social History Narrative   Right handed   Retired   Lives with wife one story home   Caffeine none   Social Determinants of Health   Financial Resource Strain: Not on file  Food Insecurity: No Food Insecurity (03/02/2022)   Received from [facility], [facility]   Hunger Vital Sign    Worried About Running Out of Food in the Last Year: Never true    Ran Out of Food in the Last Year: Never true  Transportation Needs: Not on file  Physical Activity: Not on file  Stress: Not on file  Social Connections: Unknown (03/31/2022)   Received from [facility], [facility]   Social Network    Social Network: Not on file    Family History  Problem Relation Age of Onset   Heart attack Mother    Heart disease Sister    COPD Sister     Health Maintenance  Topic Date Due   Medicare Annual Wellness (AWV)  Never done   FOOT EXAM  Never done   DTaP/Tdap/Td (1 - Tdap)  Never done   Pneumonia Vaccine 34+ Years old (2 of 2 - PPSV23 or PCV20) 08/07/2017   OPHTHALMOLOGY EXAM  04/09/2023   Diabetic kidney evaluation - Urine ACR  06/16/2023   Hepatitis C Screening  12/16/2023 (Originally 07/03/1964)   COVID-19 Vaccine (5 - 2023-24 season) 01/01/2024 (Originally 07/29/2022)   INFLUENZA VACCINE  06/29/2023   HEMOGLOBIN A1C  11/17/2023   Diabetic kidney evaluation - eGFR measurement  12/16/2023   Zoster Vaccines- Shingrix  Completed   HPV VACCINES  Aged Out     ----------------------------------------------------------------------------------------------------------------------------------------------------------------------------------------------------------------- Physical Exam BP (!) 100/57 (BP Location: Left Arm, Patient Position: Sitting, Cuff Size: Normal)   Pulse 83   Ht 5\' 6"  (1.676 m)   Wt 178 lb (80.7 kg)    SpO2 96%   BMI 28.73 kg/m   Physical Exam Constitutional:      Appearance: Normal appearance.  Cardiovascular:     Rate and Rhythm: Normal rate and regular rhythm.  Pulmonary:     Effort: Pulmonary effort is normal.     Breath sounds: Normal breath sounds.  Neurological:     Mental Status: He is alert.  Psychiatric:        Mood and Affect: Mood normal.        Behavior: Behavior normal.     ------------------------------------------------------------------------------------------------------------------------------------------------------------------------------------------------------------------- Assessment and Plan  Type 2 diabetes mellitus without complication, without long-term current use of insulin (HCC) He has been seeing endocrinology.  Most recent A1c is 6.9%.  He will continue [place] at current strength.  Depression with anxiety Continues to do well with bupropion.  Will plan to continue this at current strength.  Pure hypercholesterolemia Continue atorvastatin at current strength.  Mild cognitive impairment Initially diagnosed with parkinsonism and was on carbidopa levodopa.  Has been seen by neurology here and ultimately decided that he did not have parkinsonism.  He has done well off of carbidopa levodopa.  He does remain on Aricept.   Meds ordered this encounter  Medications   atorvastatin (LIPITOR) 10 MG tablet    Sig: Take 1 tablet (10 mg total) by mouth daily.    Dispense:  90 tablet    Refill:  1   buPROPion (WELLBUTRIN XL) 150 MG 24 hr tablet    Sig: Take 1 tablet (150 mg total) by mouth daily. Take in the evening in addition to 300mg  to make total of 450mg /day    Dispense:  90 tablet    Refill:  1   buPROPion (WELLBUTRIN XL) 300 MG 24 hr tablet    Sig: Take 1 tablet (300 mg total) by mouth daily.    Dispense:  90 tablet    Refill:  1   dapagliflozin propanediol (FARXIGA) 5 MG TABS tablet    Sig: Take 1 tablet (5 mg total) by mouth daily before  breakfast.    Dispense:  90 tablet    Refill:  1   donepezil (ARICEPT) 10 MG tablet    Sig: Take 1 tablet (10 mg total) by mouth daily.    Dispense:  90 tablet    Refill:  1   EPINEPHrine 0.3 mg/0.3 mL IJ SOAJ injection    Sig: Inject 0.3 mg into the muscle as needed for anaphylaxis.    Dispense:  1 each    Refill:  1    No follow-ups on file.    This visit occurred during the SARS-CoV-2 public health emergency.  Safety protocols were in place, including screening questions prior to the visit, additional usage of staff  PPE, and extensive cleaning of exam room while observing appropriate contact time as indicated for disinfecting solutions.

## 2023-06-26 NOTE — Assessment & Plan Note (Signed)
Continues to do well with bupropion.  Will plan to continue this at current strength.

## 2023-06-26 NOTE — Assessment & Plan Note (Signed)
Initially diagnosed with parkinsonism and was on carbidopa levodopa.  Has been seen by neurology here and ultimately decided that he did not have parkinsonism.  He has done well off of carbidopa levodopa.  He does remain on Aricept.

## 2023-06-26 NOTE — Assessment & Plan Note (Signed)
He has been seeing endocrinology.  Most recent A1c is 6.9%.  He will continue [place] at current strength.

## 2023-10-24 NOTE — Progress Notes (Unsigned)
NEUROLOGY FOLLOW UP OFFICE NOTE  Arunas Ishman [id number]  Subjective:  Gentle Bogle is a 77 y.o. year old male with a history of aortic valve regurgitation, DM2, depression, and memory difficulties (on aricept) who we last saw on 05/03/23.  To briefly review: Patient was previously seen in this office by Dr. Arbutus Leas (first on 02/25/22 then 04/11/22) for evaluation of parkinsonism. At that evaluation, patient endorsed double vision and right eye drooping when patient is tired or in the evening. Due to these concerns, myasthenia gravis was considered. AChR abs were sent and negative (binding, blocking, and modulating - 02/25/22). Subsequently, MuSK ab were also negative (04/11/22).   Wife has noticed his eyes are drooping for about the past 5 years at least. It is worse when he is tired. Wife will tell him to open his eyes, and he will, but then they will droop again. It has an irregular pattern per wife. Patient does not notice the drooping. He endorses occasional double vision but this is associated with position changes. He does not have double vision when reading the paper, even for long periods of time. He has occasional slurred speech. Per wife, this occurs during episodes when he is "doppy" with his memory problems. He denies any difficulties with chewing or swallowing. He denies choking. He denies neuromuscular respiratory weakness (orthopnea > dyspnea). He denies arm weakness, but does have left shoulder problem. He has diabetic neuropathy but denies significant weakness in his legs. He has fallen in the past so he walks with a walker or cane because his legs get tired. He uses the walker to sit after walking because he feels like his legs are getting weak. He falls about every other week. He does endorse low back pain. He denies muscle pain.   He was previously in PT, but does not do the exercises at home. He is not as active as he was due to not having a good place to walk.   Per wife,  he has an oddly strong sense of smell. He sometimes smells things that his wife does not smell (stinking). He hearing is also very sensitive. He denies changes in taste sensation.   He report any constitutional symptoms like fever, night sweats, anorexia or unintentional weight loss.   EtOH use: Once per month, 1 beer  Restrictive diet? No Family history of neuropathy/myopathy/NM disease? Not that he knows   He did have an EMG years ago that showed diabetic neuropathy.   He wakes up in the morning and is very tired. Wife has wanted to get him a sleep study, but patient will not do it.   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    MRI brain w/o contrast on 05/02/22 showed Generalized age related volume loss.   MRI cervical spine wo contrast on 08/03/22 showed moderate right C5-6 neural foraminal narrowing with less significant narrowing at most every level.   11/02/22: Patient and wife have moved into smaller home. This has significantly lowered his anxiety. Wife thinks he has improved in this regard. Per wife, he still has memory difficulties.   Blood work (copper, vit E, MMA) after last visit was normal.   Sleep study was normal.   Current MG like symptoms: Ptosis: nothing significant Double vision: very occasional, not significant Speech: no difficulties Chewing: no difficulties Swallowing: no difficulties Breathing: no difficulties Arm strength: no issues Leg strength: no issues  Patient had one fall while setting up a train. He was bending over, not paying attention, and lost balance.  05/03/23: Patient had one fall since last visit. It occurred at night and patient thinks he may have been half asleep. He was going to the restroom and became off balance and he fell backwards. He did not hurt himself.   Symptom check (MG like symptoms): Ptosis: None Double vision: None Speech:  Occasional Chewing: No problems Swallowing: No problems Breathing: No issues, sleeps on 1 large pillow (does for neck comfort) Arm strength: No issues Leg strength: Gets tired. Uses a walker in case he needs sit when walking further distances. Uses a cane when out.    He has more difficulty when walking in grass (less balance). He also feels his hands are weaker than previous.   He feels that his memory may be a little worse. He mentions asking questions but not remembering a few hours later. He has not been getting lost in familiar places. He is independent of all ADLs.  Most recent Assessment and Plan (05/03/23): This is Quintarius Drysdale, a 77 y.o. male with: cognitive changes, neuropathy, and hand weakness. Patient initially saw me for ptosis and concern for MG. These symptoms are not significant currently. Despite the positive single fiber EMG, I see no evidence of myasthenia gravis. I will continue to monitor. 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 His memory and cognitive complaints are most consistent with mild cognitive impairment. There has been no change in his symptoms since our initial assessment. I have previously discussed re-testing with neuropsych testing, but patient and wife deferred and would prefer to monitor symptoms.   In terms of hand weakness, which patient mentioned today, he does have bilateral thumb abduction weakness and mild atrophy of bilateral APB that could be consistent with carpal tunnel syndrome. When I mentioned this to patient, he stated that he may have been told that in the past. His wife did not remember this, though it could have been before they were married. We discussed EMG, but patient would like to think about it. I also gave patient information on wrist splints, which he will also consider.    Plan: -Continue donepezil for memory changes and cognitive complaints -Will  continue to monitor for possible MG -Will continue to monitor memory and cognitive function -Fall precautions discussed -Discussed possible EMG for hand weakness or wrist splints for possible carpal tunnel syndrome. Patient to consider.  Since their last visit: ***  Cognitive changes? Diabetic neuropathy? Hand weakness?  Current MG-like symptoms: Ptosis: *** Double vision: *** Speech: *** Chewing: *** Swallowing: *** Breathing: *** Arm strength: *** Leg strength: ***  MEDICATIONS:  Outpatient Encounter Medications as of 11/03/2023  Medication Sig   acetaminophen (TYLENOL) 500 MG tablet Take 500 mg by mouth every 6 (six) hours as needed.   atorvastatin (LIPITOR) 10 MG tablet Take 1 tablet (10 mg total) by mouth daily.   buPROPion (WELLBUTRIN XL) 150 MG 24 hr tablet Take 1 tablet (150 mg total) by mouth daily. Take in the evening in addition to 300mg  to make total of 450mg /day   buPROPion (WELLBUTRIN XL) 300 MG 24 hr tablet Take 1 tablet (300 mg total) by mouth daily.   dapagliflozin propanediol (FARXIGA) 5 MG TABS tablet Take 1 tablet (5 mg total) by mouth daily before breakfast.   donepezil (ARICEPT) 10 MG tablet Take 1 tablet (10 mg total) by mouth daily.  EPINEPHrine 0.3 mg/0.3 mL IJ SOAJ injection Inject 0.3 mg into the muscle as needed for anaphylaxis.   No facility-administered encounter medications on file as of 11/03/2023.    PAST MEDICAL HISTORY: Past Medical History:  Diagnosis Date   Aortic valve stenosis    Cognitive change    Diabetic acidosis, type I (HCC)    Heart murmur     PAST SURGICAL HISTORY: Past Surgical History:  Procedure Laterality Date   APPENDECTOMY     KIDNEY STONE SURGERY     SHOULDER ARTHROSCOPY WITH ROTATOR CUFF REPAIR Left     ALLERGIES: Allergies  Allergen Reactions   Bee Venom    Fluoxetine    Metformin And Related     FAMILY HISTORY: Family History  Problem Relation Age of Onset   Heart attack Mother    Heart disease  Sister    COPD Sister     SOCIAL HISTORY: Social History   Tobacco Use   Smoking status: Never    Passive exposure: Never   Smokeless tobacco: Never  Vaping Use   Vaping status: Never Used  Substance Use Topics   Alcohol use: Never    Comment: rarely   Drug use: Never   Social History   Social History Narrative   Right handed   Retired   Lives with wife one story home   Caffeine none      Objective:  Vital Signs:  There were no vitals taken for this visit.  ***  Labs and Imaging review: New results: 05/18/23: HbA1c 6.9  Previously reviewed results: 12/15/22: HbA1c 7.0 CMP significant for elevated glucose (153), Cr (1.25), and Ca (11) CBC unremarkable   08/19/22 labs: copper, MMA wnl   AChR ab (blocking, binding, modulating) (02/25/22): negative MuSK ab (04/11/22): negative HbA1c (02/09/22): 6.5   07/20/22 labs: Normal or unremarkable: B1, folate, TSH B12 (361)   Sleep study (09/16/22): SLEEP ARCHITECTURE Patient was studied for 434.5 minutes. The sleep efficiency was 100.0 % and the patient was supine for 0%. The arousal index was 0.0 per hour.   RESPIRATORY PARAMETERS The overall AHI was 3.7 per hour, with a central apnea index of 0 per hour. The oxygen nadir was 72% during sleep.   CARDIAC DATA Mean heart rate during sleep was 70.0 bpm.   IMPRESSIONS - No significant obstructive sleep apnea occurred during this study (AHI = 3.7/h). - Oxygen desaturation was noted during this study (Min O2 = 72%). Mean 93%. Time with O2 saturation 89% or less was 1.7 minutes. - Patient snored.   DIAGNOSIS - Normal study   RECOMMENDATIONS - Manage for symptoms based on clinical judgment. - Be careful with alcohol, sedatives and other CNS depressants that may worsen sleep apnea and disrupt normal sleep architecture. - Sleep hygiene should be reviewed to assess factors that may improve sleep quality. - Weight management and regular exercise should be initiated or  continued. - Consider in-center study if these results are not consistent with clinical impression.   MRI cervical spine wo contrast (08/03/22): FINDINGS: Alignment: No listhesis.   Vertebrae: No acute fracture or suspicious osseous lesion.   Cord: Normal signal and morphology.   Posterior Fossa, vertebral arteries, paraspinal tissues: Negative.   Disc levels:   C2-C3: Small central disc protrusion. Left-greater-than-right facet and uncovertebral hypertrophy. No spinal canal stenosis. Mild left neural foraminal narrowing.   C3-C4: Minimal disc bulge. Right-greater-than-left facet and uncovertebral hypertrophy. No spinal canal stenosis. Mild left and mild-to-moderate right neural foraminal narrowing.  C4-C5: Mild disc bulge. Facet and uncovertebral hypertrophy. No spinal canal stenosis. Mild bilateral neural foraminal narrowing.   C5-C6: Disc height loss with disc osteophyte complex and mild disc bulge. Right-greater-than-left facet and uncovertebral hypertrophy. No spinal canal stenosis. Moderate to severe right and mild left neural foraminal narrowing.   C6-C7: Disc height loss and minimal disc bulge. Facet and uncovertebral hypertrophy. No spinal canal stenosis. Mild left neural foraminal narrowing.   C7-T1: Mild disc bulge. No spinal canal stenosis or neural foraminal narrowing.   IMPRESSION: 1. No spinal canal stenosis. 2. Multilevel facet and uncovertebral hypertrophy, which causes moderate right neural foraminal narrowing at C5-C6, with additional less significant neural foraminal narrowing seen at almost every cervical level.   Single fiber EMG (08/12/22 at [facility] by Dr. Bufford Buttner): Testing Results:  SFEMG: Single fiber EMG was performed using a disposable standard single fiber EMG electrode. Studies were performed of the left frontalis muscle under voluntary control. 21 fiber pairs were examined with a mean MCD of 47.7 uSec (ref <43.8 uSec). 23 % of fiber pairs  demonstrated increased jitter with blocking and the remaining 77% were normal (< 74.1 uSec).  ___________________________________________________________________________________ Conclusion: This study is abnormal. 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   MRI brain wo contrast (05/02/22): FINDINGS: Brain: Generalized age related volume loss. No focal abnormality seen affecting the brainstem or cerebellum. Cerebral hemispheres show minimal/mild chronic small-vessel ischemic change of the white matter. No cortical or large vessel territory infarction. No mass lesion, hemorrhage, hydrocephalus or extra-axial collection.   Vascular: Major vessels at the base of the brain show flow.   Skull and upper cervical spine: Negative   Sinuses/Orbits: Clear/normal   Other: Question ferromagnetic foreign object in the left frontal scalp with subsequent artifact.   IMPRESSION: No acute or reversible finding. Generalized age related volume loss. Minimal/mild chronic small-vessel ischemic change of the cerebral hemispheric white matter.   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  Assessment/Plan:  This is Keysean Granier, a 77 y.o. male with: ***   Plan: ***  Return to clinic in ***  Total time spent reviewing records, interview, history/exam, documentation, and coordination of care on day of encounter:  *** min  Jacquelyne Balint, MD

## 2023-11-03 ENCOUNTER — Ambulatory Visit: Payer: Medicare Other | Admitting: Neurology

## 2024-07-30 ENCOUNTER — Encounter: Payer: Self-pay | Admitting: Sports Medicine
